# Patient Record
Sex: Female | Born: 1965 | Race: White | Hispanic: No | Marital: Married | Smoking: Former smoker
Health system: Southern US, Community
[De-identification: ages and names within clinical notes are randomized; demographics above are authoritative.]

## PROBLEM LIST (undated history)

## (undated) DIAGNOSIS — R7303 Prediabetes: Secondary | ICD-10-CM

## (undated) DIAGNOSIS — E119 Type 2 diabetes mellitus without complications: Secondary | ICD-10-CM

## (undated) DIAGNOSIS — Z9289 Personal history of other medical treatment: Secondary | ICD-10-CM

## (undated) DIAGNOSIS — R002 Palpitations: Secondary | ICD-10-CM

## (undated) DIAGNOSIS — F419 Anxiety disorder, unspecified: Secondary | ICD-10-CM

## (undated) DIAGNOSIS — R51 Headache: Secondary | ICD-10-CM

## (undated) DIAGNOSIS — E785 Hyperlipidemia, unspecified: Secondary | ICD-10-CM

## (undated) HISTORY — DX: Headache: R51

## (undated) HISTORY — DX: Anxiety disorder, unspecified: F41.9

## (undated) HISTORY — PX: AUGMENTATION MAMMAPLASTY: SUR837

## (undated) HISTORY — DX: Hyperlipidemia, unspecified: E78.5

## (undated) HISTORY — DX: Personal history of other medical treatment: Z92.89

## (undated) HISTORY — DX: Prediabetes: R73.03

## (undated) HISTORY — DX: Palpitations: R00.2

## (undated) HISTORY — DX: Type 2 diabetes mellitus without complications: E11.9

---

## 1987-07-05 HISTORY — PX: CHOLECYSTECTOMY: SHX55

## 1990-07-04 HISTORY — PX: TUBAL LIGATION: SHX77

## 1991-07-05 HISTORY — PX: AUGMENTATION MAMMAPLASTY: SUR837

## 2004-05-24 ENCOUNTER — Ambulatory Visit: Payer: Self-pay | Admitting: Family Medicine

## 2004-12-09 ENCOUNTER — Ambulatory Visit: Payer: Self-pay | Admitting: Family Medicine

## 2005-07-20 ENCOUNTER — Ambulatory Visit: Payer: Self-pay | Admitting: Family Medicine

## 2005-12-07 ENCOUNTER — Ambulatory Visit: Payer: Self-pay | Admitting: Internal Medicine

## 2006-12-19 ENCOUNTER — Telehealth (INDEPENDENT_AMBULATORY_CARE_PROVIDER_SITE_OTHER): Payer: Self-pay | Admitting: *Deleted

## 2007-03-23 ENCOUNTER — Telehealth (INDEPENDENT_AMBULATORY_CARE_PROVIDER_SITE_OTHER): Payer: Self-pay | Admitting: *Deleted

## 2007-07-12 ENCOUNTER — Ambulatory Visit: Payer: Self-pay | Admitting: Family Medicine

## 2007-07-12 DIAGNOSIS — E1169 Type 2 diabetes mellitus with other specified complication: Secondary | ICD-10-CM | POA: Insufficient documentation

## 2007-07-12 DIAGNOSIS — E7800 Pure hypercholesterolemia, unspecified: Secondary | ICD-10-CM

## 2007-07-16 ENCOUNTER — Telehealth (INDEPENDENT_AMBULATORY_CARE_PROVIDER_SITE_OTHER): Payer: Self-pay | Admitting: Internal Medicine

## 2007-07-16 LAB — CONVERTED CEMR LAB: Direct LDL: 46.4 mg/dL

## 2007-07-17 DIAGNOSIS — Z87898 Personal history of other specified conditions: Secondary | ICD-10-CM

## 2007-07-17 DIAGNOSIS — G43909 Migraine, unspecified, not intractable, without status migrainosus: Secondary | ICD-10-CM | POA: Insufficient documentation

## 2007-08-29 ENCOUNTER — Ambulatory Visit: Payer: Self-pay | Admitting: Family Medicine

## 2007-08-29 DIAGNOSIS — Z9189 Other specified personal risk factors, not elsewhere classified: Secondary | ICD-10-CM | POA: Insufficient documentation

## 2008-04-29 ENCOUNTER — Telehealth (INDEPENDENT_AMBULATORY_CARE_PROVIDER_SITE_OTHER): Payer: Self-pay | Admitting: Internal Medicine

## 2008-06-11 ENCOUNTER — Ambulatory Visit: Payer: Self-pay | Admitting: Family Medicine

## 2008-06-12 LAB — CONVERTED CEMR LAB
BUN: 14 mg/dL (ref 6–23)
Cholesterol: 235 mg/dL (ref 0–200)
Creatinine, Ser: 0.8 mg/dL (ref 0.4–1.2)
GFR calc Af Amer: 100 mL/min
GFR calc non Af Amer: 83 mL/min
HDL: 33.3 mg/dL — ABNORMAL LOW (ref >39.0)
VLDL: 165 mg/dL — ABNORMAL HIGH (ref 0–40)

## 2008-06-13 ENCOUNTER — Ambulatory Visit: Payer: Self-pay | Admitting: Family Medicine

## 2008-06-13 DIAGNOSIS — R7309 Other abnormal glucose: Secondary | ICD-10-CM | POA: Insufficient documentation

## 2008-06-16 ENCOUNTER — Telehealth (INDEPENDENT_AMBULATORY_CARE_PROVIDER_SITE_OTHER): Payer: Self-pay | Admitting: Internal Medicine

## 2008-07-14 ENCOUNTER — Telehealth (INDEPENDENT_AMBULATORY_CARE_PROVIDER_SITE_OTHER): Payer: Self-pay | Admitting: Internal Medicine

## 2008-07-18 ENCOUNTER — Ambulatory Visit: Payer: Self-pay | Admitting: Internal Medicine

## 2008-07-23 LAB — CONVERTED CEMR LAB
Creatinine,U: 132.2 mg/dL
Microalb Creat Ratio: 5.3 mg/g (ref 0.0–30.0)
Microalb, Ur: 0.7 mg/dL (ref 0.0–1.9)

## 2008-07-29 LAB — CONVERTED CEMR LAB
Cholesterol: 90 mg/dL (ref 0–200)
LDL Cholesterol: 42 mg/dL (ref 0–99)
Microalb Creat Ratio: 5.3 mg/g (ref 0.0–30.0)
Triglycerides: 89 mg/dL (ref 0–149)
VLDL: 18 mg/dL (ref 0–40)

## 2009-01-28 ENCOUNTER — Ambulatory Visit: Payer: Self-pay | Admitting: Family Medicine

## 2009-01-28 LAB — CONVERTED CEMR LAB
Cholesterol: 120 mg/dL (ref 0–200)
Glucose, Bld: 111 mg/dL — ABNORMAL HIGH (ref 70–99)
LDL Cholesterol: 57 mg/dL (ref 0–99)
Triglycerides: 109 mg/dL (ref 0.0–149.0)

## 2009-04-30 ENCOUNTER — Ambulatory Visit: Payer: Self-pay | Admitting: Family Medicine

## 2009-05-01 LAB — CONVERTED CEMR LAB
Cholesterol: 120 mg/dL (ref 0–200)
Hgb A1c MFr Bld: 6.1 % (ref 4.6–6.5)
LDL Cholesterol: 58 mg/dL (ref 0–99)
Triglycerides: 104 mg/dL (ref 0.0–149.0)

## 2009-05-07 ENCOUNTER — Ambulatory Visit: Payer: Self-pay | Admitting: Family Medicine

## 2009-05-26 ENCOUNTER — Telehealth (INDEPENDENT_AMBULATORY_CARE_PROVIDER_SITE_OTHER): Payer: Self-pay | Admitting: Internal Medicine

## 2009-08-25 ENCOUNTER — Encounter: Payer: Self-pay | Admitting: Family Medicine

## 2009-08-26 ENCOUNTER — Ambulatory Visit: Payer: Self-pay | Admitting: Family Medicine

## 2009-08-26 DIAGNOSIS — R002 Palpitations: Secondary | ICD-10-CM | POA: Insufficient documentation

## 2009-08-28 LAB — CONVERTED CEMR LAB
Basophils Relative: 0.3 % (ref 0.0–3.0)
Eosinophils Relative: 1.2 % (ref 0.0–5.0)
HCT: 42.9 % (ref 36.0–46.0)
Hemoglobin: 14.2 g/dL (ref 12.0–15.0)
Lymphs Abs: 1.9 10*3/uL (ref 0.7–4.0)
MCV: 91.4 fL (ref 78.0–100.0)
Monocytes Absolute: 0.5 10*3/uL (ref 0.1–1.0)
Neutro Abs: 6.6 10*3/uL (ref 1.4–7.7)
Platelets: 317 10*3/uL (ref 150.0–400.0)
RBC: 4.69 M/uL (ref 3.87–5.11)
WBC: 9.1 10*3/uL (ref 4.5–10.5)

## 2009-11-05 ENCOUNTER — Ambulatory Visit: Payer: Self-pay | Admitting: Family Medicine

## 2009-11-08 LAB — CONVERTED CEMR LAB
ALT: 17 units/L (ref 0–35)
AST: 16 units/L (ref 0–37)
HDL: 32.3 mg/dL — ABNORMAL LOW (ref >39.00)
LDL Cholesterol: 50 mg/dL (ref 0–99)
Total CHOL/HDL Ratio: 4
VLDL: 32.2 mg/dL (ref 0.0–40.0)

## 2010-01-20 ENCOUNTER — Ambulatory Visit: Payer: Self-pay | Admitting: Family Medicine

## 2010-04-07 ENCOUNTER — Telehealth: Payer: Self-pay | Admitting: Family Medicine

## 2010-04-08 ENCOUNTER — Telehealth: Payer: Self-pay | Admitting: Family Medicine

## 2010-04-22 ENCOUNTER — Ambulatory Visit: Payer: Self-pay | Admitting: Family Medicine

## 2010-04-22 LAB — CONVERTED CEMR LAB
Direct LDL: 65.7 mg/dL
Total CHOL/HDL Ratio: 4

## 2010-04-27 ENCOUNTER — Telehealth: Payer: Self-pay | Admitting: Family Medicine

## 2010-04-29 ENCOUNTER — Telehealth: Payer: Self-pay | Admitting: Family Medicine

## 2010-05-10 ENCOUNTER — Telehealth: Payer: Self-pay | Admitting: Family Medicine

## 2010-05-31 ENCOUNTER — Telehealth: Payer: Self-pay | Admitting: Family Medicine

## 2010-06-02 ENCOUNTER — Telehealth: Payer: Self-pay | Admitting: Family Medicine

## 2010-06-08 ENCOUNTER — Ambulatory Visit: Payer: Self-pay | Admitting: Family Medicine

## 2010-06-08 DIAGNOSIS — G43909 Migraine, unspecified, not intractable, without status migrainosus: Secondary | ICD-10-CM | POA: Insufficient documentation

## 2010-06-10 ENCOUNTER — Telehealth: Payer: Self-pay | Admitting: Family Medicine

## 2010-07-09 ENCOUNTER — Telehealth: Payer: Self-pay | Admitting: Family Medicine

## 2010-07-26 ENCOUNTER — Telehealth: Payer: Self-pay | Admitting: Family Medicine

## 2010-08-03 NOTE — Progress Notes (Signed)
Summary: Advisory for Atenolol   Phone Note From Pharmacy Call back at fax [phone]   Caller: [facility] Call For: Dr. Dayton Martes  Summary of Call: Received fax from [facility] regarding Medication Nonadherence Therapy Advisory.  Form in your IN box. Initial call taken by: Linde Gillis CMA Duncan Dull),  May 31, 2010 4:30 PM  Follow-up for Phone Call        thank you. Ruthe Mannan MD  June 01, 2010 7:18 AM

## 2010-08-03 NOTE — Progress Notes (Signed)
Summary: Rx Flexeril  Phone Note Refill Request Call back at [phone] Message from:  [facility] on May 10, 2010 11:38 AM  Refills Requested: Medication #1:  FLEXERIL 10 MG  TABS take one by mouth q hs   Last Refilled: 02/05/2010  Method Requested: Electronic Initial call taken by: Sydell Axon LPN,  May 10, 2010 11:39 AM  Follow-up for Phone Call        Rx called to pharmacy Follow-up by: Linde Gillis CMA Duncan Dull),  May 10, 2010 12:00 PM    Prescriptions: FLEXERIL 10 MG  TABS (CYCLOBENZAPRINE HCL) take one by mouth q hs  #30 x 0   Entered and Authorized by:   Ruthe Mannan MD   Signed by:   Ruthe Mannan MD on 05/10/2010   Method used:   Telephoned to ...         RxID:   [id number]

## 2010-08-03 NOTE — Progress Notes (Signed)
Summary: regarding headaches  Phone Note Call from Patient Call back at [phone]   Caller: Patient Summary of Call: Pt had been seeing Dr Darden Amber for headaches, but he has moved to another facility.  She is asking if you can start following her with her headaches,  she doesnt want to continue with Dr. Darden Amber.  Pt states her headaches are under control, she has been on the same meds for 3-4 years. Initial call taken by: Lowella Petties CMA,  April 07, 2010 11:21 AM  Follow-up for Phone Call        yes that is ok. Ruthe Mannan MD  April 07, 2010 11:28 AM   Additional Follow-up for Phone Call Additional follow up Details #1::        Advised pt, left message on machine that that is fine with Dr. Dayton Martes  Additional Follow-up by: Lowella Petties CMA,  April 07, 2010 12:20 PM

## 2010-08-03 NOTE — Progress Notes (Signed)
Summary: wants to change from trilipix  Phone Note Call from Patient Call back at Work Phone [phone] Call back at x [phone]   Caller: Patient Call For: Ruthe Mannan MD Summary of Call: Pt is asking if she can change from trilipix to fenofibrate due to the cost. It will cost her about $40.00 less.   [facility] [address]. Initial call taken by: Lowella Petties CMA, AAMA,  April 27, 2010 9:18 AM  Follow-up for Phone Call        Patient advised as instructed via telephone, Rx sent to pharmacy, 6 month f/u appt made for 10/04/2010. Follow-up by: Linde Gillis CMA Duncan Dull),  April 27, 2010 10:52 AM    New/Updated Medications: FENOFIBRATE 54 MG TABS (FENOFIBRATE) 1 tab by mouth daily. Prescriptions: FENOFIBRATE 54 MG TABS (FENOFIBRATE) 1 tab by mouth daily.  #30 x 0   Entered by:   Linde Gillis CMA (AAMA)   Authorized by:   Ruthe Mannan MD   Signed by:   Linde Gillis CMA (AAMA) on 04/27/2010   Method used:   Electronically to        [facility] (retail)       [address]       [place], [place]  [address]       Ph: [phone]       Fax: [phone]   RxID:   [id number]

## 2010-08-03 NOTE — Progress Notes (Signed)
Summary: Rx Sumatriptan  Phone Note Refill Request Call back at [phone] Message from:  [id number] on April 29, 2010 7:10 AM  Refills Requested: Medication #1:  IMITREX 100 MG TABS take one by mouth as directed prn   Last Refilled: 04/08/2010 Received E-script request please advise.   Method Requested: Telephone to Pharmacy Initial call taken by: Linde Gillis CMA Duncan Dull),  April 29, 2010 7:11 AM    Prescriptions: IMITREX 100 MG TABS (SUMATRIPTAN SUCCINATE) take one by mouth as directed prn  #10 x 0   Entered and Authorized by:   Ruthe Mannan MD   Signed by:   Ruthe Mannan MD on 04/29/2010   Method used:   Electronically to        [facility] (retail)       [address]       [place], [place]  [address]       Ph: [phone]       Fax: [phone]   RxID:   [id number]

## 2010-08-03 NOTE — Progress Notes (Signed)
Summary: imitrex  Phone Note Refill Request Message from:  Fax from Pharmacy on April 08, 2010 10:16 AM  Refills Requested: Medication #1:  IMITREX 100 MG TABS take one by mouth as directed prn   Last Refilled: 03/11/2010 Refill request from [facility] [address] [phone].   Initial call taken by: Melody Comas,  April 08, 2010 10:17 AM    Prescriptions: IMITREX 100 MG TABS (SUMATRIPTAN SUCCINATE) take one by mouth as directed prn  #10 x 0   Entered and Authorized by:   Ruthe Mannan MD   Signed by:   Ruthe Mannan MD on 04/08/2010   Method used:   Electronically to        [facility] (retail)       [address]       [place], [place]  [address]       Ph: [phone]       Fax: [phone]   RxID:   [id number]

## 2010-08-03 NOTE — Assessment & Plan Note (Signed)
Summary: 30 MIN TRANSFER FROM [facility]   Vital Signs:  Patient profile:   45 year old female Height:      63.25 inches Weight:      153.8 pounds BMI:     27.13 Temp:     98.2 degrees F oral Pulse rate:   84 / minute Pulse rhythm:   regular BP sitting:   100 / 70  (left arm) Cuff size:   regular  Vitals Entered By: Benny Lennert CMA Duncan Dull) (January 20, 2010 8:01 AM)  History of Present Illness: 45 yo female here to establish care with me.  HLD- had labs drawn in 11/2009. Has not yet discussed results.  LDL 50, HDL 32, TG 161.   On Trilipix 135 mg daily, upsets her stomach.  Often cannot take it.  Migraines- has been doing better with Atenolol 35 mg daily.  Uses IMitrex as needed.  Has not had one in over a month.  ?h/o AODM-a1c has been stable at 6.0.  Has lost 20 pounds since she was told her CBGs were elevated.  Denies any symptoms of hypo or hyperglycemia.  Current Medications (verified): 1)  Zocor 40 Mg  Tabs (Simvastatin) .Marland Kitchen.. 1 Daily For Cholesterol 2)  Flexeril 10 Mg  Tabs (Cyclobenzaprine Hcl) .... Take One By Mouth Q Hs 3)  Atenolol 25 Mg  Tabs (Atenolol) .... Take One By Mouth Daily 4)  Imitrex 100 Mg Tabs (Sumatriptan Succinate) .... Take One By Mouth As Directed Prn 5)  Gabapentin 600 Mg Tabs (Gabapentin) .... Take Two By Mouth Daily 6)  Zoloft 50 Mg Tabs (Sertraline Hcl) .... One Tablet Daily 7)  Niaspan 500 Mg Tbcr (Niacin (Antihyperlipidemic)) .Marland Kitchen.. 1 Tab By Mouth At Bedtime.  Take 81 Mg of Apirin 30 Min To 1 Hour Before To Prevent Flushing.  Allergies (verified): No Known Drug Allergies  Past History:  Past Medical History: Last updated: 08/26/2009 headaches  anxiety  palpitations hyperlipidemia     headache clinic -- [facility]  Past Surgical History: Last updated: 07/17/2007         [facility]  Family History: Last updated: 08/26/2009 sister and brother -- ? trigemeny  Social History: Last  updated: 07/17/2007 Marital Status: Married Children: 2 Occupation: operations admin  Risk Factors: Smoking Status: quit (07/17/2007)  Review of Systems      See HPI General:  Denies malaise. Eyes:  Denies blurring. CV:  Denies chest pain or discomfort. Psych:  Denies anxiety and depression.  Physical Exam  General:  Well-developed,well-nourished,in no acute distress; alert,appropriate and cooperative throughout examination Lungs:  Normal respiratory effort, chest expands symmetrically. Lungs are clear to auscultation, no crackles or wheezes. Heart:  Normal rate and regular rhythm. S1 and S2 normal without gallop, murmur, click, rub or other extra sounds. 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  D/c Trilipix due to side effects.  LDL is great, so will start Niaspan.  Discussed decreasing added sugars, eliminate trans fats, increase fiber and limit alcohol.  All these changes together can drop triglycerides by almost 50%. Recheck FLP in 3 months.  The following medications were removed from the medication list:    Trilipix 135 Mg Cpdr (Choline fenofibrate) .Marland Kitchen... 1 once daily for triglycerides  by mouth  Her updated medication list for this problem includes:    Zocor 40 Mg Tabs (Simvastatin) .Marland Kitchen... 1 daily for cholesterol    Niaspan 500 Mg Tbcr (Niacin (antihyperlipidemic)) .Marland Kitchen... 1 tab by mouth at bedtime.  take 81 mg of apirin 30 min to 1 hour before to prevent flushing.  Problem # 2:  HYPERGLYCEMIA (ICD-790.29) Assessment: Improved a1c 6.0 with normal fasting CBG. Removed AODM from problem list.    Complete Medication List: 1)  Zocor 40 Mg Tabs (Simvastatin) .Marland Kitchen.. 1 daily for cholesterol 2)  Flexeril 10 Mg Tabs (Cyclobenzaprine  hcl) .... Take one by mouth q hs 3)  Atenolol 25 Mg Tabs (Atenolol) .... Take one by mouth daily 4)  Imitrex 100 Mg Tabs (Sumatriptan succinate) .... Take one by mouth as directed prn 5)  Gabapentin 600 Mg Tabs (Gabapentin) .... Take two by mouth daily 6)  Zoloft 50 Mg Tabs (Sertraline hcl) .... One tablet daily 7)  Niaspan 500 Mg Tbcr (Niacin (antihyperlipidemic)) .Marland Kitchen.. 1 tab by mouth at bedtime.  take 81 mg of apirin 30 min to 1 hour before to prevent flushing.  Patient Instructions: 1)  Please come back in 3 months for fasting lipid panel (272.4) Prescriptions: NIASPAN 500 MG TBCR (NIACIN (ANTIHYPERLIPIDEMIC)) 1 tab by mouth at bedtime.  Take 81 mg of apirin 30 min to 1 hour before to prevent flushing.  #30 x 3   Entered and Authorized by:   Ruthe Mannan MD   Signed by:   Ruthe Mannan MD on 01/20/2010   Method used:   Electronically to        [facility] (retail)       [address]       [place], [place]  [address]       Ph: [phone] or [phone]       Fax: [phone]   RxID:   [id number]   Current Allergies (reviewed today): No known allergies

## 2010-08-03 NOTE — Assessment & Plan Note (Signed)
Summary: HEART IS FLUTTERING A LITTLE / LFW   Vital Signs:  Patient profile:   45 year old female Height:      63.25 inches Weight:      158.75 pounds BMI:     28.00 Temp:     97.9 degrees F oral Pulse rate:   84 / minute Pulse rhythm:   regular BP sitting:   116 / 70  (left arm) Cuff size:   regular  Vitals Entered By: Lewanda Rife LPN (August 26, 2009 8:43 AM)  History of Present Illness: has had some sensation of heart fluttering , off and on for a year or more    after lunch monday- symptoms started -- and it was worse than usual  tuesday - less often  this am - few fleeting episodes   feels like a small ittiy bitty jump  no pain -- just wump wump  no sob or sweaty or pain or nauseated  will sometimes take a deep breath  throat has been burning for a couple of days -- ? a little acid reflux   not exertional - exercises with no problem   does take bp med for ha prev  ran out for several days - atelolol    last nt at softball game no problems  some anx problems -- is a Product/process development scientist in general   brother and sister both have irreg hr - ? trigemeny -- later in life  EKG is ok today   caffiene some - 1 soda at lunch       Allergies (verified): No Known Drug Allergies  Past History:  Past Surgical History: Last updated: 07/17/2007         [facility]  Family History: Last updated: 08/26/2009 sister and brother -- ? 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 trigemeny  Review of Systems General:  Denies fatigue, fever, loss of appetite, and malaise. Eyes:  Denies blurring and eye irritation. CV:  Denies chest pain or discomfort  and lightheadness. Derm:  Denies itching and rash. Neuro:  Denies difficulty with concentration, headaches, numbness, and tingling. Psych:  Denies depression. Endo:  Denies cold intolerance, excessive thirst, excessive urination, and heat intolerance. Heme:  Denies abnormal bruising and bleeding.  Physical Exam  General:  Well-developed,well-nourished,in no acute distress; alert,appropriate and cooperative throughout examination Head:  normocephalic, atraumatic, and no abnormalities observed.   Eyes:  vision grossly intact, pupils equal, pupils round, and pupils reactive to light.  no conjunctival pallor, injection or icterus  Mouth:  pharynx pink and moist.   Neck:  supple with full rom and no masses or thyromegally, no JVD or carotid bruit  Chest Wall:  No deformities, masses, or tenderness noted. Lungs:  Normal respiratory effort, chest expands symmetrically. Lungs are clear to auscultation, no crackles or wheezes. Heart:  Normal rate and regular rhythm. S1 and S2 normal without gallop, murmur, click, rub or other extra sounds. Abdomen:  Bowel sounds positive,abdomen soft and non-tender without masses, organomegaly or hernias noted. no renal bruits  Msk:  No deformity or scoliosis noted of thoracic or lumbar spine.  no acute joint changes Pulses:  R and L carotid,radial,femoral,dorsalis pedis and posterior tibial pulses are full and equal bilaterally Extremities:  No clubbing, cyanosis, edema, or deformity noted with normal full range of motion of all joints.   Neurologic:  sensation intact to light touch, gait normal, and DTRs symmetrical and normal.  no tremor  Skin:  Intact without suspicious lesions or rashes Cervical Nodes:  No lymphadenopathy noted Psych:  slt anxious  good eye contact and comm skills    Impression & Recommendations:  Problem # 1:  PALPITATIONS (ICD-785.1) Assessment New feel this is multifactorial with anxiety / missed doses of atenolol / and caffiene plan  to hold caffiene and get back on atenolol consider counseling for anx in the future  (note nl EKG today)  check cbc with diff and tsh today udate if not imp 2 wk- consider cardiol/ monitor  update earlier if not imp or if worse or other symptoms (rev s/s angina in detail today)   Her updated medication list for this problem includes:    Atenolol 25 Mg Tabs (Atenolol) .Marland Kitchen... Take one by mouth daily  Orders: Venipuncture (60454) TLB-CBC Platelet - w/Differential (85025-CBCD) TLB-TSH (Thyroid Stimulating Hormone) (84443-TSH) EKG w/ Interpretation (93000)  Complete Medication List: 1)  Zocor 40 Mg Tabs (Simvastatin) .Marland Kitchen.. 1 daily for cholesterol 2)  Flexeril 10 Mg Tabs (Cyclobenzaprine hcl) .... Take one by mouth q hs 3)  Atenolol 25 Mg Tabs (Atenolol) .... Take one by mouth daily 4)  Imitrex 100 Mg Tabs (Sumatriptan succinate) .... Take one by mouth as directed prn 5)  Gabapentin 600 Mg Tabs (Gabapentin) .... Take two by mouth daily 6)  Trilipix 135 Mg Cpdr (Choline fenofibrate) .Marland Kitchen.. 1 once daily for triglycerides  by mouth  Patient Instructions: 1)  stop caffiene 2)  drink lots of water 3)  get back on atenolol  4)  labs for anemia and thyroid today  5)  if not improved in 2 weeks - let me know   Current Allergies (reviewed today): No known allergies    EKG  Procedure date:  08/26/2009  Findings:      NSR with rate of 87 and no acute changes

## 2010-08-03 NOTE — Progress Notes (Signed)
Summary: Rx Cyclobenzaprine  Phone Note Refill Request Call back at [phone] Message from:  [facility] on June 10, 2010 11:57 AM  Refills Requested: Medication #1:  FLEXERIL 10 MG  TABS take one by mouth q hs   Last Refilled: 05/10/2010 Received E-script request please advise.   Method Requested: Electronic Initial call taken by: Linde Gillis CMA Duncan Dull),  June 10, 2010 11:58 AM    Prescriptions: FLEXERIL 10 MG  TABS (CYCLOBENZAPRINE HCL) take one by mouth q hs  #30 x 0   Entered and Authorized by:   Ruthe Mannan MD   Signed by:   Ruthe Mannan MD on 06/10/2010   Method used:   Electronically to        [facility] (retail)       [address]       [place], [place]  [address]       Ph: [phone]       Fax: [phone]   RxID:   [id number]

## 2010-08-03 NOTE — Assessment & Plan Note (Signed)
Summary: F/U MIGRAINE HA/CLE   Vital Signs:  Patient profile:   45 year old female Height:      63.25 inches Weight:      162.50 pounds BMI:     28.66 Temp:     97.9 degrees F oral Pulse rate:   72 / minute Pulse rhythm:   regular BP sitting:   110 / 72  (left arm) Cuff size:   regular  Vitals Entered By: Linde Gillis CMA Duncan Dull) (June 08, 2010 8:05 AM) CC: follow up migraines   History of Present Illness: 45 yo here to follow up migraines.  Has been a migraine suffer for years.  Used to go to [facility] and [facility]. Takes Atenolol 25 mg and Gabapentin for prevention, which has helped a little (years ago had daily migraines). Now gets 9-10 migraines per month.  Takes Imitrex as abortive therapy.  This time of year, migraines typically occur more frequently as sinus headaches tend to trigger her migraines.   Allergies (verified): No Known Drug Allergies  Review of Systems      See HPI General:  Denies malaise. Neuro:  Denies numbness, sensation of room spinning, visual disturbances, and weakness.  Physical Exam  General:  Well-developed,well-nourished,in no acute distress; alert,appropriate and cooperative throughout examination Mouth:  pharynx pink and moist.   Neurologic:  sensation intact to light touch, gait normal, and DTRs symmetrical and normal.  no tremor  Psych:  slt anxious  good eye contact and comm skills    Impression & Recommendations:  Problem # 1:  MIGRAINE HEADACHE (ICD-346.90) Assessment Deteriorated  Discussed different tx options. She is using Imitrex too frequently and we need to improve her preventative therapy. Will d/c atenolol and try topamax, will titrate dose slowly to minimize side effects. Pt to follow up in one month.  Her updated medication list for this problem includes:    Atenolol 25 Mg Tabs (Atenolol) .Marland Kitchen... Take one by mouth daily    Imitrex 100 Mg Tabs (Sumatriptan succinate) .Marland Kitchen... Take one by mouth as directed  prn  Complete Medication List: 1)  Zocor 40 Mg Tabs (Simvastatin) .Marland Kitchen.. 1 daily for cholesterol 2)  Flexeril 10 Mg Tabs (Cyclobenzaprine hcl) .... Take one by mouth q hs 3)  Atenolol 25 Mg Tabs (Atenolol) .... Take one by mouth daily 4)  Imitrex 100 Mg Tabs (Sumatriptan succinate) .... Take one by mouth as directed prn 5)  Gabapentin 600 Mg Tabs (Gabapentin) .... Take two by mouth daily 6)  Fenofibrate 54 Mg Tabs (Fenofibrate) .Marland Kitchen.. 1 tab by mouth daily. 7)  Topiramate 50 Mg Tabs (Topiramate) .... 1/2 tab by mouth at bedtime x 1 week, then 1 tab by mouth at bedtime.  Patient Instructions: 1)  We can stop your atenolol. 2)  Take your topamax as prescribed, call me in 1 month with an update.   Prescriptions: TOPIRAMATE 50 MG TABS (TOPIRAMATE) 1/2 tab by mouth at bedtime x 1 week, then 1 tab by mouth at bedtime.  #60 x 0   Entered and Authorized by:   Ruthe Mannan MD   Signed by:   Ruthe Mannan MD on 06/08/2010   Method used:   Electronically to        [facility] (retail)       [address]       [place], [place]  [address]       Ph: [phone]       Fax: [phone]   RxID:   [id number]  Orders Added: 1)  Est. Patient Level IV [41324]    Current Allergies (reviewed today): No known allergies

## 2010-08-03 NOTE — Progress Notes (Signed)
Summary: Rx Sumatriptan  Phone Note Refill Request Call back at [phone] Message from:  Wandalee Ferdinand on June 02, 2010 7:48 AM  Refills Requested: Medication #1:  IMITREX 100 MG TABS take one by mouth as directed prn   Last Refilled: 04/29/2010 Received e-script refill request please advise.   Method Requested: Telephone to Pharmacy Initial call taken by: Linde Gillis CMA Duncan Dull),  June 02, 2010 7:49 AM  Follow-up for Phone Call        She is over using imitrex..10 used in last month...i will refill..but I recommend an appt with Dr. Dayton Martes or other MD to discuss migrane prevention medicaiton before any further prescriptions.  Follow-up by: Kerby Nora MD,  June 02, 2010 8:19 AM  Additional Follow-up for Phone Call Additional follow up Details #1::        Left message on voicemail at patients job advising as instructed.  Advised her to call our office to schedule an appt to discuss migraine prevention.   Additional Follow-up by: Linde Gillis CMA Duncan Dull),  June 02, 2010 8:36 AM    Prescriptions: IMITREX 100 MG TABS (SUMATRIPTAN SUCCINATE) take one by mouth as directed prn  #10 x 0   Entered and Authorized by:   Kerby Nora MD   Signed by:   Kerby Nora MD on 06/02/2010   Method used:   Electronically to        [facility] (retail)       [address]       [place], [place]  [address]       Ph: [phone]       Fax: [phone]   RxID:   [id number]   Appended Document: Rx Sumatriptan agreed. thank you.

## 2010-08-05 NOTE — Progress Notes (Signed)
Summary: Rx Flexeril  Phone Note Refill Request Call back at [phone] Message from:  [facility] on July 09, 2010 10:16 AM  Refills Requested: Medication #1:  FLEXERIL 10 MG  TABS take one by mouth q hs   Last Refilled: 06/10/2010 Received E-script request please advise.   Method Requested: Telephone to Pharmacy Initial call taken by: Linde Gillis CMA Duncan Dull),  July 09, 2010 10:17 AM  Follow-up for Phone Call        Rx called to pharmacy Follow-up by: Linde Gillis CMA Duncan Dull),  July 09, 2010 10:26 AM    Prescriptions: FLEXERIL 10 MG  TABS (CYCLOBENZAPRINE HCL) take one by mouth q hs  #30 x 0   Entered and Authorized by:   Ruthe Mannan MD   Signed by:   Ruthe Mannan MD on 07/09/2010   Method used:   Telephoned to ...         RxID:   [id number]

## 2010-08-05 NOTE — Progress Notes (Signed)
Summary: gabapentin  Phone Note Refill Request Message from:  Fax from Pharmacy on July 26, 2010 3:40 PM  Refills Requested: Medication #1:  GABAPENTIN 600 MG TABS take two by mouth daily   Last Refilled: 04/28/2010 Refill request from [facility] [address] [phone]  Initial call taken by: Melody Comas,  July 26, 2010 3:40 PM  Follow-up for Phone Call        Rx called to pharmacy Follow-up by: Linde Gillis CMA Duncan Dull),  July 26, 2010 4:23 PM    Prescriptions: GABAPENTIN 600 MG TABS (GABAPENTIN) take two by mouth daily  #90 x 0   Entered and Authorized by:   Ruthe Mannan MD   Signed by:   Ruthe Mannan MD on 07/26/2010   Method used:   Telephoned to ...         RxID:   [id number]

## 2010-08-09 ENCOUNTER — Telehealth: Payer: Self-pay | Admitting: Family Medicine

## 2010-08-16 ENCOUNTER — Telehealth: Payer: Self-pay | Admitting: Family Medicine

## 2010-08-19 NOTE — Progress Notes (Signed)
Summary: imitrex  Phone Note Refill Request Message from:  Patient on August 09, 2010 8:47 AM  Refills Requested: Medication #1:  IMITREX 100 MG TABS take one by mouth as directed prn Patient says that she is completley out. She had a migraine over the weekend and didn't have any meds because it was denied on friday. [facility] [address].   Initial call taken by: Melody Comas,  August 09, 2010 8:47 AM  Follow-up for Phone Call        Rx sent to pharmacy, patient notified via telephone. Follow-up by: Linde Gillis CMA Duncan Dull),  August 09, 2010 9:35 AM    Prescriptions: IMITREX 100 MG TABS (SUMATRIPTAN SUCCINATE) take one by mouth as directed prn  #9 Tablet x 0   Entered by:   Linde Gillis CMA (AAMA)   Authorized by:   Ruthe Mannan MD   Signed by:   Linde Gillis CMA (AAMA) on 08/09/2010   Method used:   Electronically to        [facility] (retail)       [address]       [place], [place]  [address]       Ph: [phone]       Fax: [phone]   RxID:   [id number] IMITREX 100 MG TABS (SUMATRIPTAN SUCCINATE) take one by mouth as directed prn  #9 Tablet x 0   Entered and Authorized by:   Ruthe Mannan MD   Signed by:   Ruthe Mannan MD on 08/09/2010   Method used:   Telephoned to ...         RxID:   [id number]

## 2010-08-25 NOTE — Progress Notes (Signed)
Summary: Rx Cyclobenzaprine  Phone Note Refill Request Call back at [phone] Message from:  Madilyn Hook on August 16, 2010 8:40 AM  Refills Requested: Medication #1:  FLEXERIL 10 MG  TABS take one by mouth q hs   Last Refilled: 07/09/2010 Received E-script request please advise.   Method Requested: Telephone to Pharmacy Initial call taken by: Linde Gillis CMA [facility]),  August 16, 2010 8:40 AM    Prescriptions: FLEXERIL 10 MG  TABS (CYCLOBENZAPRINE HCL) take one by mouth q hs  #30 x 0   Entered and Authorized by:   Ruthe Mannan MD   Signed by:   Ruthe Mannan MD on 08/16/2010   Method used:   Electronically to        [id number] (retail)       [address]       [place], [place]  [address]       Ph: [phone]       Fax: [phone]   RxID:   [id number]

## 2010-09-02 ENCOUNTER — Telehealth: Payer: Self-pay | Admitting: Family Medicine

## 2010-09-09 NOTE — Progress Notes (Signed)
Summary: Rx Gabapentin & Imitrex  Phone Note Refill Request Call back at [phone] Message from:  [id number] on September 02, 2010 2:05 PM  Refills Requested: Medication #1:  GABAPENTIN 600 MG TABS take two by mouth daily   Last Refilled: 07/26/2010  Medication #2:  IMITREX 100 MG TABS take one by mouth as directed prn   Last Refilled: 07/26/2010 Received E-script request please advise.   Method Requested: Electronic Initial call taken by: Linde Gillis CMA [facility]),  September 02, 2010 2:06 PM    Prescriptions: GABAPENTIN 600 MG TABS (GABAPENTIN) take two by mouth daily  #90 x 0   Entered and Authorized by:   Ruthe Mannan MD   Signed by:   Ruthe Mannan MD on 09/02/2010   Method used:   Electronically to        [id number] (retail)       [address]       [place], [place]  [address]       Ph: [phone]       Fax: [phone]   RxID:   [id number] IMITREX 100 MG TABS (SUMATRIPTAN SUCCINATE) take one by mouth as directed prn  #9 Tablet x 0   Entered and Authorized by:   Ruthe Mannan MD   Signed by:   Ruthe Mannan MD on 09/02/2010   Method used:   Electronically to        [facility] (retail)       [address]       [place], [place]  [address]       Ph: [phone]       Fax: [phone]   RxID:   [id number]

## 2010-09-21 ENCOUNTER — Other Ambulatory Visit: Payer: Self-pay | Admitting: Family Medicine

## 2010-09-21 MED ORDER — CYCLOBENZAPRINE HCL 10 MG PO TABS: 10.0000 mg | ORAL_TABLET | Freq: Every day | ORAL | Status: DC

## 2010-09-22 MED ORDER — CYCLOBENZAPRINE HCL 10 MG PO TABS: 10.0000 mg | ORAL_TABLET | Freq: Every day | ORAL | Status: DC

## 2010-09-29 ENCOUNTER — Other Ambulatory Visit: Payer: Self-pay | Admitting: Family Medicine

## 2010-09-30 ENCOUNTER — Other Ambulatory Visit: Payer: Self-pay | Admitting: Family Medicine

## 2010-09-30 DIAGNOSIS — E7800 Pure hypercholesterolemia, unspecified: Secondary | ICD-10-CM

## 2010-10-04 ENCOUNTER — Other Ambulatory Visit (INDEPENDENT_AMBULATORY_CARE_PROVIDER_SITE_OTHER): Payer: PRIVATE HEALTH INSURANCE | Admitting: Family Medicine

## 2010-10-04 DIAGNOSIS — E7800 Pure hypercholesterolemia, unspecified: Secondary | ICD-10-CM

## 2010-10-04 LAB — LIPID PANEL
Cholesterol: 153 mg/dL (ref 0–200)
HDL: 37.6 mg/dL — ABNORMAL LOW (ref >39.00)
Total CHOL/HDL Ratio: 4
VLDL: 72.4 mg/dL — ABNORMAL HIGH (ref 0.0–40.0)

## 2010-10-04 LAB — LDL CHOLESTEROL, DIRECT: Direct LDL: 62.9 mg/dL

## 2010-10-04 LAB — HEPATIC FUNCTION PANEL
Albumin: 4.1 g/dL (ref 3.5–5.2)
Total Protein: 6.8 g/dL (ref 6.0–8.3)

## 2010-10-27 ENCOUNTER — Other Ambulatory Visit: Payer: Self-pay | Admitting: Family Medicine

## 2010-10-28 ENCOUNTER — Other Ambulatory Visit: Payer: Self-pay | Admitting: Family Medicine

## 2010-10-29 ENCOUNTER — Other Ambulatory Visit: Payer: Self-pay | Admitting: *Deleted

## 2010-10-29 MED ORDER — GABAPENTIN 600 MG PO TABS: ORAL_TABLET | ORAL | Status: DC

## 2010-10-29 MED ORDER — CYCLOBENZAPRINE HCL 10 MG PO TABS: 10.0000 mg | ORAL_TABLET | Freq: Every day | ORAL | Status: DC

## 2010-10-29 NOTE — Telephone Encounter (Signed)
Patient requested Rx refills.

## 2010-10-31 ENCOUNTER — Other Ambulatory Visit: Payer: Self-pay | Admitting: Family Medicine

## 2010-11-02 ENCOUNTER — Other Ambulatory Visit: Payer: Self-pay | Admitting: *Deleted

## 2010-11-02 NOTE — Telephone Encounter (Signed)
This was blank.  Do you know what this was intended to say?

## 2010-11-03 MED ORDER — SUMATRIPTAN SUCCINATE 100 MG PO TABS: 100.0000 mg | ORAL_TABLET | ORAL | Status: DC | PRN

## 2010-11-28 ENCOUNTER — Other Ambulatory Visit: Payer: Self-pay | Admitting: Family Medicine

## 2010-12-26 ENCOUNTER — Other Ambulatory Visit: Payer: Self-pay | Admitting: Family Medicine

## 2011-01-12 ENCOUNTER — Other Ambulatory Visit: Payer: Self-pay | Admitting: Family Medicine

## 2011-01-18 ENCOUNTER — Other Ambulatory Visit: Payer: Self-pay | Admitting: Family Medicine

## 2011-01-26 ENCOUNTER — Other Ambulatory Visit: Payer: Self-pay | Admitting: Family Medicine

## 2011-02-10 ENCOUNTER — Other Ambulatory Visit: Payer: Self-pay | Admitting: Family Medicine

## 2011-02-11 NOTE — Telephone Encounter (Signed)
Sent, please notify pt.  Thanks.   

## 2011-02-11 NOTE — Telephone Encounter (Signed)
Patient advised as instructed via telephone. 

## 2011-02-16 ENCOUNTER — Other Ambulatory Visit: Payer: Self-pay | Admitting: Family Medicine

## 2011-02-24 ENCOUNTER — Other Ambulatory Visit: Payer: Self-pay | Admitting: Family Medicine

## 2011-02-25 ENCOUNTER — Other Ambulatory Visit: Payer: PRIVATE HEALTH INSURANCE

## 2011-02-25 NOTE — Telephone Encounter (Signed)
Patient hasn't been seen since 06/2010, please advise.

## 2011-03-01 ENCOUNTER — Other Ambulatory Visit: Payer: PRIVATE HEALTH INSURANCE

## 2011-03-03 ENCOUNTER — Other Ambulatory Visit (INDEPENDENT_AMBULATORY_CARE_PROVIDER_SITE_OTHER): Payer: PRIVATE HEALTH INSURANCE

## 2011-03-03 ENCOUNTER — Ambulatory Visit: Payer: PRIVATE HEALTH INSURANCE | Admitting: Family Medicine

## 2011-03-03 DIAGNOSIS — R739 Hyperglycemia, unspecified: Secondary | ICD-10-CM

## 2011-03-03 DIAGNOSIS — E7800 Pure hypercholesterolemia, unspecified: Secondary | ICD-10-CM

## 2011-03-03 DIAGNOSIS — R7309 Other abnormal glucose: Secondary | ICD-10-CM

## 2011-03-03 LAB — LIPID PANEL
Cholesterol: 132 mg/dL (ref 0–200)
VLDL: 50.2 mg/dL — ABNORMAL HIGH (ref 0.0–40.0)

## 2011-03-03 LAB — BASIC METABOLIC PANEL
BUN: 19 mg/dL (ref 6–23)
GFR: 78.26 mL/min (ref >60.00)
Potassium: 4.6 mEq/L (ref 3.5–5.1)

## 2011-03-09 ENCOUNTER — Other Ambulatory Visit: Payer: Self-pay | Admitting: *Deleted

## 2011-03-09 MED ORDER — SUMATRIPTAN SUCCINATE 100 MG PO TABS: 100.0000 mg | ORAL_TABLET | ORAL | Status: DC | PRN

## 2011-03-09 NOTE — Telephone Encounter (Signed)
Patient notified as instructed by telephone. 

## 2011-03-09 NOTE — Telephone Encounter (Signed)
For the record- I have not seen that request Will refil electronically

## 2011-03-09 NOTE — Telephone Encounter (Signed)
Pt.states her pharmacy has faxed a request but we havent responded.  OK to refill?  Pt uses [facility].

## 2011-04-04 ENCOUNTER — Other Ambulatory Visit: Payer: Self-pay | Admitting: Family Medicine

## 2011-04-04 NOTE — Telephone Encounter (Signed)
Patient hasn't been seen since 06/2010, please advise. 

## 2011-04-17 ENCOUNTER — Other Ambulatory Visit: Payer: Self-pay | Admitting: Family Medicine

## 2011-05-18 ENCOUNTER — Other Ambulatory Visit: Payer: Self-pay | Admitting: Family Medicine

## 2011-05-19 NOTE — Telephone Encounter (Signed)
Last OV 06/2010, new patient 01/2010.  Please advise.

## 2011-05-30 ENCOUNTER — Other Ambulatory Visit: Payer: Self-pay | Admitting: Family Medicine

## 2011-07-05 ENCOUNTER — Other Ambulatory Visit: Payer: Self-pay | Admitting: Family Medicine

## 2011-07-18 ENCOUNTER — Other Ambulatory Visit: Payer: Self-pay | Admitting: Family Medicine

## 2011-07-19 ENCOUNTER — Other Ambulatory Visit: Payer: Self-pay | Admitting: Internal Medicine

## 2011-07-19 MED ORDER — FENOFIBRATE 54 MG PO TABS: 54.0000 mg | ORAL_TABLET | Freq: Two times a day (BID) | ORAL | Status: DC

## 2011-07-19 NOTE — Telephone Encounter (Signed)
Ok to refill one month.  Needs CPX for additional refills.

## 2011-07-19 NOTE — Telephone Encounter (Signed)
Patient advised as instructed via telephone.  She stated that she wasn't aware that she needed a physical every year, she thought she could wait until she turned 48 to start getting physicals yearly.  I advised patient that she does need a yearly exam.  Rx for Fenofibrate sent to pharmacy [id number], Rx for #30 was cancelled.

## 2011-07-19 NOTE — Telephone Encounter (Signed)
Rx's sent electronically, called [facility] and left a message on pharmacy voicemail to notify patient that this will be her last refill until she calls our office, schedules a CPX, and keeps that appt.

## 2011-07-19 NOTE — Telephone Encounter (Signed)
Talked with Janice Greene last fall it was discussed to double up on fenofibrate and now she is now wanting to and wants to know if it's ok to do that.  Plus she needs 60 pills called in instead of 30.  Please advise.

## 2011-07-19 NOTE — Telephone Encounter (Signed)
Patient hasn't been seen since 06/2010, transferred care to Dr. Dayton Martes 01/2010.  Please advise.

## 2011-07-19 NOTE — Telephone Encounter (Signed)
Yes ok to double on current dose but she should be aware that when taken with a statin (zocor) can worsen the potential for muscle aches. Keep an eye on that and come see me for labs and follow up visit in 8 weeks.

## 2011-07-25 ENCOUNTER — Encounter: Payer: PRIVATE HEALTH INSURANCE | Admitting: Family Medicine

## 2011-07-27 ENCOUNTER — Other Ambulatory Visit: Payer: Self-pay | Admitting: Family Medicine

## 2011-07-27 NOTE — Telephone Encounter (Signed)
This is Dr Dellie Catholic pt. [facility] request refill Imitrex 100 mg. Pt last seen 06/08/10. Pt already scheduled for CPX with Dr Dayton Martes 08/02/11. Dr Dayton Martes will be back in office on 07/28/11.

## 2011-07-27 NOTE — Telephone Encounter (Signed)
Will refill electronically to get by until her appt

## 2011-07-28 NOTE — Telephone Encounter (Signed)
Pt called and said med had not been called in. I explained Dr Milinda Antis sent yesterday. I called pharmacy and gave verbal rx to [facility] [address]. Pt notified and apprecitated.

## 2011-08-01 ENCOUNTER — Encounter: Payer: Self-pay | Admitting: Family Medicine

## 2011-08-02 ENCOUNTER — Ambulatory Visit (INDEPENDENT_AMBULATORY_CARE_PROVIDER_SITE_OTHER): Payer: PRIVATE HEALTH INSURANCE | Admitting: Family Medicine

## 2011-08-02 ENCOUNTER — Encounter: Payer: Self-pay | Admitting: Family Medicine

## 2011-08-02 VITALS — BP 120/72 | HR 90 | Temp 97.6°F | Ht 64.0 in | Wt 158.4 lb

## 2011-08-02 DIAGNOSIS — Z0000 Encounter for general adult medical examination without abnormal findings: Secondary | ICD-10-CM | POA: Insufficient documentation

## 2011-08-02 DIAGNOSIS — R7309 Other abnormal glucose: Secondary | ICD-10-CM

## 2011-08-02 DIAGNOSIS — R739 Hyperglycemia, unspecified: Secondary | ICD-10-CM

## 2011-08-02 DIAGNOSIS — E7800 Pure hypercholesterolemia, unspecified: Secondary | ICD-10-CM

## 2011-08-02 DIAGNOSIS — Z87898 Personal history of other specified conditions: Secondary | ICD-10-CM

## 2011-08-02 DIAGNOSIS — Z1231 Encounter for screening mammogram for malignant neoplasm of breast: Secondary | ICD-10-CM

## 2011-08-02 DIAGNOSIS — G43909 Migraine, unspecified, not intractable, without status migrainosus: Secondary | ICD-10-CM

## 2011-08-02 LAB — LIPID PANEL
HDL: 43 mg/dL (ref >39.00)
Triglycerides: 250 mg/dL — ABNORMAL HIGH (ref 0.0–149.0)

## 2011-08-02 LAB — BASIC METABOLIC PANEL
BUN: 14 mg/dL (ref 6–23)
CO2: 24 mEq/L (ref 19–32)
Calcium: 9.2 mg/dL (ref 8.4–10.5)
Creatinine, Ser: 1 mg/dL (ref 0.4–1.2)
GFR: 66.05 mL/min (ref >60.00)
Glucose, Bld: 171 mg/dL — ABNORMAL HIGH (ref 70–99)

## 2011-08-02 LAB — HEPATIC FUNCTION PANEL
ALT: 30 U/L (ref 0–35)
Albumin: 4.5 g/dL (ref 3.5–5.2)
Total Protein: 7.8 g/dL (ref 6.0–8.3)

## 2011-08-02 MED ORDER — ALPRAZOLAM 0.25 MG PO TABS: 0.2500 mg | ORAL_TABLET | Freq: Three times a day (TID) | ORAL | Status: AC | PRN

## 2011-08-02 NOTE — Progress Notes (Signed)
Subjective:    Patient ID: Janice Greene, female    DOB: [birth year], 46 y.o.   MRN: [id number]  HPI  46 yo here for CPX.  Dr. Elana Alm is her OBGYN, last pap in 10/2010.  No h/o abnormal pap smears. Due for mammogram.  HLD-  Lab Results  Component Value Date   CHOL 132 03/03/2011   HDL 45.10 03/03/2011   LDLCALC 50 11/05/2009   LDLDIRECT 65.6 03/03/2011   TRIG 251.0* 03/03/2011   CHOLHDL 3 03/03/2011    On Trilipix 135 mg daily upsets her stomach.  Often cannot take it.  Taking her Zocor as well.  Migraines- has been doing better with Atenolol 35 mg daily.  Uses IMitrex as needed.  Had one a few weeks ago but under a lot of stress. Her mother is going into a nursing home.  Often feels very anxious when her mom is in pain, overall she feels she is coping ok. No SI or HI.  ?h/o AODM-a1c has been stable at 6.0.    Denies any symptoms of hypo or hyperglycemia.  Lab Results  Component Value Date   HGBA1C 6.0 11/05/2009   Wt Readings from Last 3 Encounters:  08/02/11 158 lb 6.4 oz (71.85 kg)  06/08/10 162 lb 8 oz (73.71 kg)  01/20/10 153 lb 12.8 oz (69.763 kg)     Review of Systems See HPI Patient reports no  vision/ hearing changes,anorexia, weight change, fever ,adenopathy, persistant / recurrent hoarseness, swallowing issues, chest pain, edema,persistant / recurrent cough, hemoptysis, dyspnea(rest, exertional, paroxysmal nocturnal), gastrointestinal  bleeding (melena, rectal bleeding), abdominal pain, excessive heart burn, GU symptoms(dysuria, hematuria, pyuria, voiding/incontinence  Issues) syncope, focal weakness, severe memory loss, concerning skin lesions, depression, anxiety, abnormal bruising/bleeding, major joint swelling, breast masses or abnormal vaginal bleeding.       Objective:   Physical Exam BP 120/72  Pulse 90  Temp(Src) 97.6 F (36.4 C) (Oral)  Ht 5\' 4"  (1.626 m)  Wt 158 lb 6.4 oz (71.85 kg)  BMI 27.19 kg/m2  SpO2 98%   General:   Well-developed,well-nourished,in no acute distress; alert,appropriate and cooperative throughout examination Head:  normocephalic and atraumatic.   Eyes:  vision grossly intact, pupils equal, pupils round, and pupils reactive to light.   Ears:  R ear normal and L ear normal.   Nose:  no external deformity.   Mouth:  good dentition.   Neck:  No deformities, masses, or tenderness noted. Lungs:  Normal respiratory effort, chest expands symmetrically. Lungs are clear to auscultation, no crackles or wheezes. Heart:  Normal rate and regular rhythm. S1 and S2 normal without gallop, murmur, click, rub or other extra sounds. Abdomen:  Bowel sounds positive,abdomen soft and non-tender without masses, organomegaly or hernias noted. Msk:  No deformity or scoliosis noted of thoracic or lumbar spine.   Extremities:  No clubbing, cyanosis, edema, or deformity noted with normal full range of motion of all joints.   Neurologic:  alert & oriented X3 and gait normal.   Skin:  Intact without suspicious lesions or rashes Cervical Nodes:  No lymphadenopathy noted Axillary Nodes:  No palpable lymphadenopathy Psych:  Cognition and judgment appear intact. Alert and cooperative with normal attention span and concentration. No apparent delusions, illusions, hallucinations      Assessment & Plan:   1. Routine general medical examination at a health care facility  Reviewed preventive care protocols, scheduled due services, and updated immunizations Discussed nutrition, exercise, diet, and healthy lifestyle.  Basic Metabolic Panel (BMET)  MM Digital Screening  2. Pure hypercholesterolemia  Hepatic function panel, Lipid Profile  3. Acute grief rxn Coping well.  Will give rx for as needed xanax. Discussed sedation and addiction potential. The patient indicates understanding of these issues and agrees with the plan.

## 2011-08-02 NOTE — Patient Instructions (Signed)
Good to see you. Please stop to see Janice Greene on your way out. I hope your mother feels better soon.

## 2011-08-10 ENCOUNTER — Other Ambulatory Visit: Payer: Self-pay | Admitting: Family Medicine

## 2011-09-06 ENCOUNTER — Ambulatory Visit: Payer: PRIVATE HEALTH INSURANCE

## 2011-09-07 ENCOUNTER — Other Ambulatory Visit: Payer: Self-pay | Admitting: Family Medicine

## 2011-09-11 ENCOUNTER — Other Ambulatory Visit: Payer: Self-pay | Admitting: Family Medicine

## 2011-09-12 NOTE — Telephone Encounter (Signed)
Received refill request electronically from pharmacy. Is it okay to refill medication? 

## 2011-09-13 NOTE — Telephone Encounter (Signed)
E prescribe error notice received.  Checked with pharmacy, scripts were received.

## 2011-10-05 ENCOUNTER — Other Ambulatory Visit: Payer: Self-pay | Admitting: Family Medicine

## 2011-10-25 ENCOUNTER — Other Ambulatory Visit: Payer: Self-pay | Admitting: *Deleted

## 2011-10-25 MED ORDER — SUMATRIPTAN SUCCINATE 100 MG PO TABS: 100.0000 mg | ORAL_TABLET | ORAL | Status: DC | PRN

## 2011-10-25 MED ORDER — CYCLOBENZAPRINE HCL 10 MG PO TABS: 10.0000 mg | ORAL_TABLET | Freq: Every evening | ORAL | Status: DC | PRN

## 2011-10-25 MED ORDER — GABAPENTIN 600 MG PO TABS: 600.0000 mg | ORAL_TABLET | Freq: Two times a day (BID) | ORAL | Status: DC

## 2011-10-25 MED ORDER — ALPRAZOLAM 0.25 MG PO TABS: 0.2500 mg | ORAL_TABLET | Freq: Three times a day (TID) | ORAL | Status: DC | PRN

## 2011-10-25 NOTE — Telephone Encounter (Signed)
rx called to pharmacy 

## 2011-10-25 NOTE — Telephone Encounter (Signed)
neurontin last filled 09-08-2011 Flexeril  Last filled 09-12-2011 Sumatriptan last filled 10-05-2011 Patient also requesting refill on alprazolam 0.25 take 1 tab tid as needed for sleep or anxiety last fill 08-02-2011

## 2011-11-17 ENCOUNTER — Other Ambulatory Visit: Payer: Self-pay | Admitting: Family Medicine

## 2011-11-24 ENCOUNTER — Other Ambulatory Visit: Payer: Self-pay | Admitting: Family Medicine

## 2011-12-07 ENCOUNTER — Other Ambulatory Visit: Payer: Self-pay

## 2011-12-07 MED ORDER — SUMATRIPTAN SUCCINATE 100 MG PO TABS: ORAL_TABLET | ORAL | Status: DC

## 2011-12-07 MED ORDER — ALPRAZOLAM 0.25 MG PO TABS: 0.2500 mg | ORAL_TABLET | Freq: Three times a day (TID) | ORAL | Status: DC | PRN

## 2011-12-07 NOTE — Telephone Encounter (Signed)
[facility] faxed request Imitrex. Last filled 11/17/11.Please advise.

## 2011-12-07 NOTE — Telephone Encounter (Signed)
Xanax called to pharmacy. 

## 2011-12-25 ENCOUNTER — Other Ambulatory Visit: Payer: Self-pay | Admitting: Family Medicine

## 2011-12-29 ENCOUNTER — Other Ambulatory Visit: Payer: Self-pay | Admitting: Family Medicine

## 2012-01-04 ENCOUNTER — Ambulatory Visit
Admission: RE | Admit: 2012-01-04 | Discharge: 2012-01-04 | Disposition: A | Discharge status: Home / Self Care | Payer: PRIVATE HEALTH INSURANCE | Source: Ambulatory Visit | Attending: Family Medicine | Admitting: Family Medicine

## 2012-01-04 ENCOUNTER — Other Ambulatory Visit: Payer: Self-pay | Admitting: Family Medicine

## 2012-01-04 DIAGNOSIS — Z1231 Encounter for screening mammogram for malignant neoplasm of breast: Secondary | ICD-10-CM

## 2012-01-10 ENCOUNTER — Encounter: Payer: Self-pay | Admitting: *Deleted

## 2012-01-10 ENCOUNTER — Encounter: Payer: Self-pay | Admitting: Family Medicine

## 2012-01-11 ENCOUNTER — Other Ambulatory Visit: Payer: Self-pay | Admitting: *Deleted

## 2012-01-11 MED ORDER — ALPRAZOLAM 0.25 MG PO TABS: 0.2500 mg | ORAL_TABLET | Freq: Three times a day (TID) | ORAL | Status: DC | PRN

## 2012-01-11 NOTE — Telephone Encounter (Signed)
Medicine called to pharmacy. 

## 2012-01-11 NOTE — Telephone Encounter (Signed)
Faxed refill request from [facility], last filled 12/07/11.

## 2012-01-17 ENCOUNTER — Other Ambulatory Visit: Payer: Self-pay | Admitting: Family Medicine

## 2012-01-31 ENCOUNTER — Other Ambulatory Visit: Payer: Self-pay | Admitting: Family Medicine

## 2012-02-03 ENCOUNTER — Other Ambulatory Visit: Payer: Self-pay | Admitting: Family Medicine

## 2012-02-27 ENCOUNTER — Other Ambulatory Visit: Payer: Self-pay | Admitting: Family Medicine

## 2012-02-28 ENCOUNTER — Other Ambulatory Visit: Payer: Self-pay | Admitting: Family Medicine

## 2012-02-28 ENCOUNTER — Other Ambulatory Visit: Payer: Self-pay | Admitting: *Deleted

## 2012-02-28 MED ORDER — ALPRAZOLAM 0.25 MG PO TABS: 0.2500 mg | ORAL_TABLET | Freq: Three times a day (TID) | ORAL | Status: DC | PRN

## 2012-02-28 NOTE — Telephone Encounter (Signed)
Faxed refill request from [facility].  Last filled 01/11/12.

## 2012-02-28 NOTE — Telephone Encounter (Signed)
Med[facility] to [facility] 

## 2012-02-29 NOTE — Telephone Encounter (Signed)
Last filled 02/03/12 for # 9.

## 2012-03-16 ENCOUNTER — Other Ambulatory Visit: Payer: Self-pay | Admitting: Family Medicine

## 2012-03-16 NOTE — Telephone Encounter (Signed)
Last filled 02/28/12

## 2012-04-05 ENCOUNTER — Other Ambulatory Visit: Payer: Self-pay | Admitting: Family Medicine

## 2012-04-06 ENCOUNTER — Other Ambulatory Visit (INDEPENDENT_AMBULATORY_CARE_PROVIDER_SITE_OTHER): Payer: PRIVATE HEALTH INSURANCE

## 2012-04-06 ENCOUNTER — Other Ambulatory Visit: Payer: Self-pay | Admitting: *Deleted

## 2012-04-06 DIAGNOSIS — E119 Type 2 diabetes mellitus without complications: Secondary | ICD-10-CM

## 2012-04-06 MED ORDER — ALPRAZOLAM 0.25 MG PO TABS: 0.2500 mg | ORAL_TABLET | Freq: Three times a day (TID) | ORAL | Status: DC | PRN

## 2012-04-06 NOTE — Telephone Encounter (Signed)
Med[facility] to [facility] 

## 2012-04-06 NOTE — Telephone Encounter (Signed)
Faxed refill request from [facility], last filled 02/28/12.

## 2012-04-09 ENCOUNTER — Other Ambulatory Visit: Payer: Self-pay | Admitting: *Deleted

## 2012-04-09 MED ORDER — METFORMIN HCL 500 MG PO TABS: 500.0000 mg | ORAL_TABLET | Freq: Two times a day (BID) | ORAL | Status: DC

## 2012-04-16 ENCOUNTER — Encounter: Payer: Self-pay | Admitting: Family Medicine

## 2012-04-16 ENCOUNTER — Ambulatory Visit (INDEPENDENT_AMBULATORY_CARE_PROVIDER_SITE_OTHER): Payer: PRIVATE HEALTH INSURANCE | Admitting: Family Medicine

## 2012-04-16 VITALS — BP 130/82 | HR 102 | Temp 98.1°F | Wt 162.2 lb

## 2012-04-16 DIAGNOSIS — J069 Acute upper respiratory infection, unspecified: Secondary | ICD-10-CM

## 2012-04-16 MED ORDER — GUAIFENESIN-CODEINE 100-10 MG/5ML PO SYRP: 5.0000 mL | ORAL_SOLUTION | Freq: Two times a day (BID) | ORAL | Status: DC | PRN

## 2012-04-16 NOTE — Assessment & Plan Note (Signed)
Anticipate viral given short duration See pt instructions. Cough syrup called in. Update if not improving as expected or any worsening.  To call at end of week with update - if not improved, would consider starting zpack to cover bronchitis or abx for sinusitis if worse head congestion sxs.

## 2012-04-16 NOTE — Progress Notes (Signed)
  Subjective:    Patient ID: Janice Greene, female    DOB: September 24, 1965, 45 y.o.   MRN: [id number]  HPI CC: ST, cold?  6d h/o ST progressively worsening along with sneezing.  + coughing leading to sore ribs.  Overall dry cough.  Temp elevated at night to 99.9.  Occasional earache, not recently.  Mild PNdrainage.  Mainly chest congestion.  So far has tried mucinex along with honey lemon cough drops and advil.  No abd pain n/v, tooth pain, HA, rash.  Granddaughter sick last weekend with croup. No smokers at home. No h/o asthma/COPD  Past Medical History  Diagnosis Date  . Headache   . Anxiety   . Palpitations   . Hyperlipidemia      Review of Systems Per HPI    Objective:   Physical Exam  Nursing note and vitals reviewed. Constitutional: She appears well-developed and well-nourished. No distress.  HENT:  Head: Normocephalic and atraumatic.  Right Ear: Hearing, tympanic membrane, external ear and ear canal normal.  Left Ear: Hearing, tympanic membrane, external ear and ear canal normal.  Nose: No mucosal edema or rhinorrhea. Right sinus exhibits no maxillary sinus tenderness and no frontal sinus tenderness. Left sinus exhibits no maxillary sinus tenderness and no frontal sinus tenderness.  Mouth/Throat: Uvula is midline, oropharynx is clear and moist and mucous membranes are normal. No oropharyngeal exudate, posterior oropharyngeal edema, posterior oropharyngeal erythema or tonsillar abscesses.       Evidently congested  Eyes: Conjunctivae normal and EOM are normal. Pupils are equal, round, and reactive to light. No scleral icterus.  Neck: Normal range of motion. Neck supple.  Cardiovascular: Normal rate, regular rhythm, normal heart sounds and intact distal pulses.   No murmur heard. Pulmonary/Chest: Effort normal and breath sounds normal. No respiratory distress. She has no wheezes. She has no rales.  Lymphadenopathy:    She has no cervical adenopathy.  Skin: Skin is warm  and dry. No rash noted.       Assessment & Plan:

## 2012-04-16 NOTE — Patient Instructions (Signed)
Sounds like you have a viral upper respiratory infection. Antibiotics are not needed for this.  Viral infections usually take 7-10 days to resolve.  The cough can last a few weeks to go away. Use medication as prescribed:  cheratussin for cough at night. May use simple mucinex with plenty of fluid. Push fluids and plenty of rest. Please return if you are not improving as expected, or if you have high fevers (>101.5) or difficulty swallowing or worsening productive cough. Call clinic with questions.  Good to see you today.

## 2012-04-25 ENCOUNTER — Other Ambulatory Visit: Payer: Self-pay | Admitting: Family Medicine

## 2012-05-08 ENCOUNTER — Other Ambulatory Visit: Payer: Self-pay | Admitting: Family Medicine

## 2012-05-09 ENCOUNTER — Other Ambulatory Visit: Payer: Self-pay | Admitting: *Deleted

## 2012-05-09 MED ORDER — ALPRAZOLAM 0.25 MG PO TABS: 0.2500 mg | ORAL_TABLET | Freq: Three times a day (TID) | ORAL | Status: DC | PRN

## 2012-05-09 NOTE — Telephone Encounter (Signed)
Faxed refill request from [facility], last filled 04/06/12.

## 2012-05-10 NOTE — Telephone Encounter (Signed)
Medicine called to [facility].

## 2012-05-17 ENCOUNTER — Other Ambulatory Visit: Payer: Self-pay | Admitting: Family Medicine

## 2012-05-17 NOTE — Telephone Encounter (Signed)
Last filled 04/25/12 

## 2012-06-04 ENCOUNTER — Other Ambulatory Visit: Payer: Self-pay | Admitting: Family Medicine

## 2012-06-22 ENCOUNTER — Other Ambulatory Visit: Payer: Self-pay | Admitting: Family Medicine

## 2012-06-25 ENCOUNTER — Other Ambulatory Visit: Payer: Self-pay | Admitting: *Deleted

## 2012-06-25 MED ORDER — ALPRAZOLAM 0.25 MG PO TABS: 0.2500 mg | ORAL_TABLET | Freq: Three times a day (TID) | ORAL | Status: DC | PRN

## 2012-06-25 NOTE — Telephone Encounter (Signed)
Please call in

## 2012-06-26 NOTE — Telephone Encounter (Signed)
rx called in

## 2012-07-08 ENCOUNTER — Other Ambulatory Visit: Payer: Self-pay | Admitting: Family Medicine

## 2012-07-11 ENCOUNTER — Other Ambulatory Visit: Payer: PRIVATE HEALTH INSURANCE

## 2012-07-12 ENCOUNTER — Other Ambulatory Visit: Payer: Self-pay | Admitting: *Deleted

## 2012-07-12 DIAGNOSIS — G43909 Migraine, unspecified, not intractable, without status migrainosus: Secondary | ICD-10-CM

## 2012-07-12 MED ORDER — SUMATRIPTAN SUCCINATE 100 MG PO TABS: ORAL_TABLET | ORAL | Status: DC

## 2012-07-13 ENCOUNTER — Other Ambulatory Visit: Payer: Self-pay | Admitting: Family Medicine

## 2012-07-13 DIAGNOSIS — R7309 Other abnormal glucose: Secondary | ICD-10-CM

## 2012-07-13 DIAGNOSIS — Z0000 Encounter for general adult medical examination without abnormal findings: Secondary | ICD-10-CM

## 2012-07-13 DIAGNOSIS — E7800 Pure hypercholesterolemia, unspecified: Secondary | ICD-10-CM

## 2012-07-19 ENCOUNTER — Other Ambulatory Visit (INDEPENDENT_AMBULATORY_CARE_PROVIDER_SITE_OTHER): Payer: PRIVATE HEALTH INSURANCE

## 2012-07-19 DIAGNOSIS — R7309 Other abnormal glucose: Secondary | ICD-10-CM

## 2012-07-19 DIAGNOSIS — Z0000 Encounter for general adult medical examination without abnormal findings: Secondary | ICD-10-CM

## 2012-07-19 DIAGNOSIS — E7800 Pure hypercholesterolemia, unspecified: Secondary | ICD-10-CM

## 2012-07-19 LAB — LIPID PANEL
HDL: 38.5 mg/dL — ABNORMAL LOW (ref >39.00)
Total CHOL/HDL Ratio: 4
VLDL: 76.2 mg/dL — ABNORMAL HIGH (ref 0.0–40.0)

## 2012-07-19 LAB — COMPREHENSIVE METABOLIC PANEL
ALT: 48 U/L — ABNORMAL HIGH (ref 0–35)
AST: 36 U/L (ref 0–37)
Alkaline Phosphatase: 68 U/L (ref 39–117)
Sodium: 137 mEq/L (ref 135–145)
Total Bilirubin: 0.8 mg/dL (ref 0.3–1.2)
Total Protein: 7.4 g/dL (ref 6.0–8.3)

## 2012-07-19 LAB — LDL CHOLESTEROL, DIRECT: Direct LDL: 61 mg/dL

## 2012-07-25 ENCOUNTER — Other Ambulatory Visit (HOSPITAL_COMMUNITY)
Admission: RE | Admit: 2012-07-25 | Discharge: 2012-07-25 | Disposition: A | Discharge status: Home / Self Care | Payer: PRIVATE HEALTH INSURANCE | Source: Ambulatory Visit | Attending: Family Medicine | Admitting: Family Medicine

## 2012-07-25 ENCOUNTER — Ambulatory Visit (INDEPENDENT_AMBULATORY_CARE_PROVIDER_SITE_OTHER): Payer: PRIVATE HEALTH INSURANCE | Admitting: Family Medicine

## 2012-07-25 VITALS — BP 130/92 | HR 88 | Temp 98.4°F | Ht 63.75 in | Wt 158.0 lb

## 2012-07-25 DIAGNOSIS — Z87898 Personal history of other specified conditions: Secondary | ICD-10-CM

## 2012-07-25 DIAGNOSIS — E7800 Pure hypercholesterolemia, unspecified: Secondary | ICD-10-CM

## 2012-07-25 DIAGNOSIS — Z23 Encounter for immunization: Secondary | ICD-10-CM

## 2012-07-25 DIAGNOSIS — IMO0002 Reserved for concepts with insufficient information to code with codable children: Secondary | ICD-10-CM

## 2012-07-25 DIAGNOSIS — E118 Type 2 diabetes mellitus with unspecified complications: Secondary | ICD-10-CM | POA: Insufficient documentation

## 2012-07-25 DIAGNOSIS — Z0000 Encounter for general adult medical examination without abnormal findings: Secondary | ICD-10-CM

## 2012-07-25 DIAGNOSIS — E1165 Type 2 diabetes mellitus with hyperglycemia: Secondary | ICD-10-CM

## 2012-07-25 DIAGNOSIS — IMO0001 Reserved for inherently not codable concepts without codable children: Secondary | ICD-10-CM

## 2012-07-25 DIAGNOSIS — Z1151 Encounter for screening for human papillomavirus (HPV): Secondary | ICD-10-CM | POA: Insufficient documentation

## 2012-07-25 DIAGNOSIS — Z01419 Encounter for gynecological examination (general) (routine) without abnormal findings: Secondary | ICD-10-CM | POA: Insufficient documentation

## 2012-07-25 MED ORDER — GLYBURIDE-METFORMIN 1.25-250 MG PO TABS: 1.0000 | ORAL_TABLET | Freq: Two times a day (BID) | ORAL | Status: DC

## 2012-07-25 MED ORDER — GLYBURIDE-METFORMIN 2.5-500 MG PO TABS: 1.0000 | ORAL_TABLET | Freq: Every day | ORAL | Status: DC

## 2012-07-25 MED ORDER — GLYBURIDE-METFORMIN 2.5-500 MG PO TABS: 1.0000 | ORAL_TABLET | Freq: Two times a day (BID) | ORAL | Status: DC

## 2012-07-25 MED ORDER — GLUCOSE BLOOD VI STRP: ORAL_STRIP | Status: DC

## 2012-07-25 NOTE — Progress Notes (Signed)
Subjective:    Patient ID: Janice Greene, female    DOB: [birth year], 47 y.o.   MRN: [id number]  HPI  47 yo here for CPX.  Had been seeing Dr. Elana Alm, OBGYN until he retired.  Last pap in 10/2010.  No h/o abnormal pap smears. Mammogram normal in 01/2012.  HLD-  Lab Results  Component Value Date   CHOL 154 07/19/2012   HDL 38.50* 07/19/2012   LDLCALC 50 11/05/2009   LDLDIRECT 61.0 07/19/2012   TRIG 381.0* 07/19/2012   CHOLHDL 4 07/19/2012    On Trilipix 135 mg daily upsets her stomach.  Often cannot take it.  Taking her Zocor as well.  Migraines- has been doing better with Atenolol 35 mg daily.  Uses IMitrex as needed.  Had one a few weeks ago but under a lot of stress. Her mother is going into a nursing home.  DM-a1c has been slowly increasing.  Up to 8.1 this month.   On Metformin 500 mg twice daily but often only takes it once a day because of stomach cramping- no nausea, diarrhea or vomiting.  Denies any symptoms of hypo or hyperglycemia.  Also admits to not being very compliant with diet.  Lab Results  Component Value Date   HGBA1C 8.1* 07/19/2012   Wt Readings from Last 3 Encounters:  04/16/12 162 lb 4 oz (73.596 kg)  08/02/11 158 lb 6.4 oz (71.85 kg)  06/08/10 162 lb 8 oz (73.71 kg)     Review of Systems See HPI Patient reports no  vision/ hearing changes,anorexia, weight change, fever ,adenopathy, persistant / recurrent hoarseness, swallowing issues, chest pain, edema,persistant / recurrent cough, hemoptysis, dyspnea(rest, exertional, paroxysmal nocturnal), gastrointestinal  bleeding (melena, rectal bleeding), abdominal pain, excessive heart burn, GU symptoms(dysuria, hematuria, pyuria, voiding/incontinence  Issues) syncope, focal weakness, severe memory loss, concerning skin lesions, depression, anxiety, abnormal bruising/bleeding, major joint swelling, breast masses or abnormal vaginal bleeding.       Objective:   Physical Exam BP 130/92  Pulse 88  Temp 98.4 F  (36.9 C)  Ht 5' 3.75" (1.619 m)  Wt 158 lb (71.668 kg)  BMI 27.33 kg/m2   General:  Well-developed,well-nourished,in no acute distress; alert,appropriate and cooperative throughout examination Head:  normocephalic and atraumatic.   Eyes:  vision grossly intact, pupils equal, pupils round, and pupils reactive to light.   Ears:  R ear normal and L ear normal.   Nose:  no external deformity.   Mouth:  good dentition.   Neck:  No deformities, masses, or tenderness noted. Breasts:  No mass, nodules, thickening, tenderness, bulging, retraction, inflamation, nipple discharge or skin changes noted.   Lungs:  Normal respiratory effort, chest expands symmetrically. Lungs are clear to auscultation, no crackles or wheezes. Heart:  Normal rate and regular rhythm. S1 and S2 normal without gallop, murmur, click, rub or other extra sounds. Abdomen:  Bowel sounds positive,abdomen soft and non-tender without masses, organomegaly or hernias noted. Rectal:  no external abnormalities.   Genitalia:  Pelvic Exam:        External: normal female genitalia without lesions or masses        Vagina: normal without lesions or masses        Cervix: normal without lesions or masses        Adnexa: normal bimanual exam without masses or fullness        Uterus: normal by palpation        Pap smear: performed Msk:  No deformity or scoliosis noted of  thoracic or lumbar spine.   Extremities:  No clubbing, cyanosis, edema, or deformity noted with normal full range of motion of all joints.   Neurologic:  alert & oriented X3 and gait normal.   Skin:  Intact without suspicious lesions or rashes Cervical Nodes:  No lymphadenopathy noted Axillary Nodes:  No palpable lymphadenopathy Psych:  Cognition and judgment appear intact. Alert and cooperative with normal attention span and concentration. No apparent delusions, illusions, hallucinations      Assessment & Plan:   1. Pure hypercholesterolemia  TG deteriorated but  likely due to deteriorated DM. See below.  2. Routine general medical examination at a health care facility  Reviewed preventive care protocols, scheduled due services, and updated immunizations Discussed nutrition, exercise, diet, and healthy lifestyle. Pap smear, influenza vaccination today  3. MIGRAINES, HX OF  Stable.  4. Diabetes mellitus type II, uncontrolled  Deteriorated.  She is willing to take Metformin 500 mg twice daily again.  Will also add glyburide so d/c metformin and start Glucovance 2.5-500 mg twice daily. Follow up a1c in 3 months.

## 2012-07-25 NOTE — Addendum Note (Signed)
Addended by: Eliezer Bottom on: 07/25/2012 08:57 AM   Modules accepted: Orders

## 2012-07-25 NOTE — Patient Instructions (Addendum)
Good to see you. We are STOPPING your metformin and starting Glucovance 1 tablet twice daily. Please check your blood sugars a few times a week and call me with an update.  Please come back in 3 months for repeat a1c.

## 2012-07-31 ENCOUNTER — Encounter: Payer: Self-pay | Admitting: *Deleted

## 2012-07-31 ENCOUNTER — Encounter: Payer: Self-pay | Admitting: Family Medicine

## 2012-08-22 ENCOUNTER — Other Ambulatory Visit: Payer: Self-pay | Admitting: Family Medicine

## 2012-08-23 ENCOUNTER — Other Ambulatory Visit: Payer: Self-pay | Admitting: *Deleted

## 2012-08-23 MED ORDER — ALPRAZOLAM 0.25 MG PO TABS: 0.2500 mg | ORAL_TABLET | Freq: Three times a day (TID) | ORAL | Status: DC | PRN

## 2012-08-23 NOTE — Telephone Encounter (Signed)
Last filled 06/26/2012

## 2012-08-24 NOTE — Telephone Encounter (Signed)
Med[facility] to [facility] 

## 2012-08-28 ENCOUNTER — Other Ambulatory Visit: Payer: Self-pay | Admitting: Family Medicine

## 2012-08-30 ENCOUNTER — Other Ambulatory Visit: Payer: Self-pay | Admitting: Family Medicine

## 2012-09-23 ENCOUNTER — Other Ambulatory Visit: Payer: Self-pay | Admitting: Family Medicine

## 2012-10-02 ENCOUNTER — Ambulatory Visit (INDEPENDENT_AMBULATORY_CARE_PROVIDER_SITE_OTHER): Payer: PRIVATE HEALTH INSURANCE | Admitting: Family Medicine

## 2012-10-02 ENCOUNTER — Encounter: Payer: Self-pay | Admitting: Family Medicine

## 2012-10-02 VITALS — BP 122/80 | HR 68 | Temp 97.8°F | Wt 156.0 lb

## 2012-10-02 DIAGNOSIS — Z87898 Personal history of other specified conditions: Secondary | ICD-10-CM

## 2012-10-02 MED ORDER — GABAPENTIN 600 MG PO TABS: 600.0000 mg | ORAL_TABLET | Freq: Three times a day (TID) | ORAL | Status: DC

## 2012-10-02 MED ORDER — ALPRAZOLAM 0.25 MG PO TABS: 0.2500 mg | ORAL_TABLET | Freq: Three times a day (TID) | ORAL | Status: DC | PRN

## 2012-10-02 MED ORDER — CYCLOBENZAPRINE HCL 10 MG PO TABS: ORAL_TABLET | ORAL | Status: DC

## 2012-10-02 MED ORDER — TOPIRAMATE 50 MG PO TABS: 50.0000 mg | ORAL_TABLET | Freq: Every day | ORAL | Status: DC
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
## 2012-10-02 NOTE — Patient Instructions (Addendum)
Good to see you. We are increasing your gabapentin to 600 mg three times daily. We are restarting Topamax.  Keep me posted.

## 2012-10-09 ENCOUNTER — Other Ambulatory Visit: Payer: Self-pay | Admitting: Family Medicine

## 2012-10-26 ENCOUNTER — Other Ambulatory Visit: Payer: Self-pay | Admitting: Family Medicine

## 2012-10-31 ENCOUNTER — Other Ambulatory Visit: Payer: Self-pay | Admitting: Family Medicine

## 2012-11-22 ENCOUNTER — Other Ambulatory Visit: Payer: Self-pay | Admitting: Family Medicine

## 2012-12-04 ENCOUNTER — Other Ambulatory Visit: Payer: Self-pay | Admitting: Family Medicine

## 2012-12-05 ENCOUNTER — Encounter: Payer: Self-pay | Admitting: *Deleted

## 2012-12-05 NOTE — Telephone Encounter (Signed)
rx already requested electronically from pharmacy

## 2012-12-11 ENCOUNTER — Other Ambulatory Visit: Payer: Self-pay | Admitting: Family Medicine

## 2012-12-11 NOTE — Telephone Encounter (Signed)
Advised patient that imitrex has been sent to her pharmacy but that she will need to check with her insurance or her pharmacy regarding whether early refill will be covered.

## 2012-12-11 NOTE — Telephone Encounter (Signed)
Pt left v/m going on vacation and requesting Imitrex few days early; pt wants call back when med called in and to know that ins will cover early request.

## 2013-01-01 ENCOUNTER — Other Ambulatory Visit: Payer: Self-pay | Admitting: Family Medicine

## 2013-01-09 ENCOUNTER — Other Ambulatory Visit: Payer: Self-pay | Admitting: Family Medicine

## 2013-01-10 ENCOUNTER — Other Ambulatory Visit: Payer: Self-pay | Admitting: Family Medicine

## 2013-01-30 ENCOUNTER — Other Ambulatory Visit: Payer: Self-pay | Admitting: Family Medicine

## 2013-02-04 ENCOUNTER — Other Ambulatory Visit: Payer: Self-pay | Admitting: Family Medicine

## 2013-02-05 ENCOUNTER — Other Ambulatory Visit: Payer: Self-pay | Admitting: Family Medicine

## 2013-02-11 ENCOUNTER — Other Ambulatory Visit: Payer: Self-pay

## 2013-02-11 DIAGNOSIS — Z1231 Encounter for screening mammogram for malignant neoplasm of breast: Secondary | ICD-10-CM

## 2013-02-26 ENCOUNTER — Ambulatory Visit
Admission: RE | Admit: 2013-02-26 | Discharge: 2013-02-26 | Disposition: A | Discharge status: Home / Self Care | Payer: PRIVATE HEALTH INSURANCE | Source: Ambulatory Visit

## 2013-02-26 ENCOUNTER — Other Ambulatory Visit: Payer: Self-pay | Admitting: Family Medicine

## 2013-02-26 DIAGNOSIS — Z1231 Encounter for screening mammogram for malignant neoplasm of breast: Secondary | ICD-10-CM

## 2013-02-27 NOTE — Telephone Encounter (Signed)
Alprazolam refill called to [facility]. 

## 2013-02-28 ENCOUNTER — Other Ambulatory Visit: Payer: Self-pay | Admitting: Family Medicine

## 2013-03-01 NOTE — Telephone Encounter (Signed)
Is this ok to refill? Pt's migraine meds were adjusted on 10/02/12 OV.

## 2013-03-24 ENCOUNTER — Other Ambulatory Visit: Payer: Self-pay | Admitting: Family Medicine

## 2013-04-01 ENCOUNTER — Other Ambulatory Visit: Payer: Self-pay | Admitting: Family Medicine

## 2013-04-01 NOTE — Telephone Encounter (Signed)
Alprazolam phoned in to pharmacy.

## 2013-04-17 ENCOUNTER — Other Ambulatory Visit: Payer: Self-pay | Admitting: Family Medicine

## 2013-04-18 NOTE — Telephone Encounter (Signed)
Electronic refill request, please advise  

## 2013-05-02 ENCOUNTER — Other Ambulatory Visit: Payer: Self-pay | Admitting: Family Medicine

## 2013-05-03 NOTE — Telephone Encounter (Signed)
Ok to refill-phone in please 

## 2013-05-03 NOTE — Telephone Encounter (Signed)
Alprazolam called to [facility]. 

## 2013-05-03 NOTE — Telephone Encounter (Signed)
Last office visit 10/02/2012.  Ok to refill? 

## 2013-05-13 ENCOUNTER — Other Ambulatory Visit: Payer: Self-pay | Admitting: Family Medicine

## 2013-05-13 NOTE — Telephone Encounter (Signed)
Last office visit 10/02/2012.  Ok to refill? 

## 2013-06-02 ENCOUNTER — Other Ambulatory Visit: Payer: Self-pay | Admitting: Internal Medicine

## 2013-06-03 ENCOUNTER — Other Ambulatory Visit: Payer: Self-pay | Admitting: Internal Medicine

## 2013-06-03 NOTE — Telephone Encounter (Signed)
Ok to phone in.

## 2013-06-04 NOTE — Telephone Encounter (Signed)
Phoned in to pharmacy. 

## 2013-06-05 ENCOUNTER — Other Ambulatory Visit: Payer: Self-pay | Admitting: Family Medicine

## 2013-06-05 NOTE — Telephone Encounter (Signed)
Last office visit 10/02/2012.  Ok to refill? 

## 2013-06-24 ENCOUNTER — Other Ambulatory Visit: Payer: Self-pay | Admitting: Internal Medicine

## 2013-07-02 ENCOUNTER — Other Ambulatory Visit: Payer: Self-pay | Admitting: Internal Medicine

## 2013-07-03 MED ORDER — CYCLOBENZAPRINE HCL 10 MG PO TABS: 10.0000 mg | ORAL_TABLET | Freq: Every day | ORAL | Status: DC

## 2013-07-03 NOTE — Telephone Encounter (Signed)
Ok to refill one time only, no refills.

## 2013-07-03 NOTE — Telephone Encounter (Signed)
Xanax was called into pharmacy and Flexeril e-scribed as directed

## 2013-07-03 NOTE — Telephone Encounter (Signed)
Dr Aron pt 

## 2013-07-03 NOTE — Telephone Encounter (Signed)
Last filled 06/03/13--please advise 

## 2013-07-03 NOTE — Addendum Note (Signed)
Addended by: Roena Malady on: 07/03/2013 01:46 PM   Modules accepted: Orders

## 2013-07-30 ENCOUNTER — Other Ambulatory Visit: Payer: Self-pay | Admitting: Family Medicine

## 2013-07-30 ENCOUNTER — Other Ambulatory Visit: Payer: Self-pay | Admitting: Internal Medicine

## 2013-07-31 NOTE — Telephone Encounter (Signed)
Spoke to pt and informed her Rx has been sent to requested pharmacy 

## 2013-07-31 NOTE — Telephone Encounter (Signed)
Xanax called in 

## 2013-07-31 NOTE — Telephone Encounter (Signed)
Pt requesting medication refill. Last ov 10/2012 with no future appts scheduled. Last Rx refill given 06/2013. pls advise.

## 2013-07-31 NOTE — Telephone Encounter (Signed)
Last filled 06/24/13 by Nicki Reaperegina Baity, however pt has not had an OV since 4/14--please advise

## 2013-07-31 NOTE — Telephone Encounter (Signed)
Last office visit 10/08/2012.  Ok to refill? 

## 2013-08-26 ENCOUNTER — Other Ambulatory Visit: Payer: Self-pay | Admitting: Family Medicine

## 2013-08-27 NOTE — Telephone Encounter (Signed)
Last office visit 10/02/2012.  Ok to refill? 

## 2013-08-27 NOTE — Telephone Encounter (Signed)
Alprazolam called to [facility].

## 2013-09-03 ENCOUNTER — Encounter: Payer: Self-pay | Admitting: Radiology

## 2013-09-04 ENCOUNTER — Other Ambulatory Visit: Payer: Self-pay | Admitting: Family Medicine

## 2013-09-04 ENCOUNTER — Ambulatory Visit (INDEPENDENT_AMBULATORY_CARE_PROVIDER_SITE_OTHER): Payer: PRIVATE HEALTH INSURANCE | Admitting: Family Medicine

## 2013-09-04 ENCOUNTER — Encounter: Payer: Self-pay | Admitting: Family Medicine

## 2013-09-04 VITALS — BP 128/78 | HR 94 | Temp 97.9°F | Ht 63.0 in | Wt 158.5 lb

## 2013-09-04 DIAGNOSIS — E7800 Pure hypercholesterolemia, unspecified: Secondary | ICD-10-CM

## 2013-09-04 DIAGNOSIS — IMO0002 Reserved for concepts with insufficient information to code with codable children: Secondary | ICD-10-CM

## 2013-09-04 DIAGNOSIS — F4323 Adjustment disorder with mixed anxiety and depressed mood: Secondary | ICD-10-CM

## 2013-09-04 DIAGNOSIS — F411 Generalized anxiety disorder: Secondary | ICD-10-CM

## 2013-09-04 DIAGNOSIS — IMO0001 Reserved for inherently not codable concepts without codable children: Secondary | ICD-10-CM

## 2013-09-04 DIAGNOSIS — E1165 Type 2 diabetes mellitus with hyperglycemia: Secondary | ICD-10-CM

## 2013-09-04 LAB — COMPREHENSIVE METABOLIC PANEL
ALK PHOS: 55 U/L (ref 39–117)
ALT: 29 U/L (ref 0–35)
AST: 21 U/L (ref 0–37)
Albumin: 4.1 g/dL (ref 3.5–5.2)
BILIRUBIN TOTAL: 0.8 mg/dL (ref 0.3–1.2)
BUN: 16 mg/dL (ref 6–23)
CO2: 27 mEq/L (ref 19–32)
Calcium: 9.3 mg/dL (ref 8.4–10.5)
Chloride: 106 mEq/L (ref 96–112)
Creatinine, Ser: 1.1 mg/dL (ref 0.4–1.2)
GFR: 58.4 mL/min — ABNORMAL LOW (ref >60.00)
Glucose, Bld: 124 mg/dL — ABNORMAL HIGH (ref 70–99)
POTASSIUM: 4.5 meq/L (ref 3.5–5.1)
SODIUM: 140 meq/L (ref 135–145)
TOTAL PROTEIN: 7.2 g/dL (ref 6.0–8.3)

## 2013-09-04 LAB — LIPID PANEL
CHOL/HDL RATIO: 4
Cholesterol: 145 mg/dL (ref 0–200)
HDL: 38.2 mg/dL — ABNORMAL LOW (ref >39.00)
LDL CALC: 53 mg/dL (ref 0–99)
Triglycerides: 270 mg/dL — ABNORMAL HIGH (ref 0.0–149.0)
VLDL: 54 mg/dL — AB (ref 0.0–40.0)

## 2013-09-04 LAB — HEMOGLOBIN A1C: Hgb A1c MFr Bld: 6.4 % (ref 4.6–6.5)

## 2013-09-04 MED ORDER — TOPIRAMATE 100 MG PO TABS: ORAL_TABLET | ORAL | Status: DC

## 2013-09-04 MED ORDER — SERTRALINE HCL 25 MG PO TABS: 25.0000 mg | ORAL_TABLET | Freq: Every day | ORAL | Status: DC

## 2013-09-04 NOTE — Assessment & Plan Note (Signed)
Overdue for a1c. Recheck labs today.

## 2013-09-04 NOTE — Assessment & Plan Note (Signed)
Deteriorated.  She is declining psychotherapy at this time.  Wants to only stay on xanax but I explained to her that this is not a good maintenance medication. Will start Zoloft 25 mg daily. She will call in a few weeks with an update.

## 2013-09-04 NOTE — Progress Notes (Signed)
Pre visit review using our clinic review tool, if applicable. No additional management support is needed unless otherwise documented below in the visit note. 
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
## 2013-09-04 NOTE — Patient Instructions (Signed)
Great to see you. We are starting Zoloft 25 mg daily.  Please call me in a few weeks with an update.  We are increasing your topamax to 100 mg nightly.

## 2013-09-04 NOTE — Assessment & Plan Note (Signed)
Check labs today.

## 2013-09-05 ENCOUNTER — Telehealth: Payer: Self-pay

## 2013-09-05 NOTE — Telephone Encounter (Signed)
Relevant patient education assigned to patient using Emmi. ° °

## 2013-09-06 ENCOUNTER — Telehealth: Payer: Self-pay

## 2013-09-06 NOTE — Telephone Encounter (Signed)
Labs, particularly a1c (blood sugar over 3 months), much better.  Great work!  No changes in glucovance dose.

## 2013-09-06 NOTE — Telephone Encounter (Signed)
Pt left v/m; pt request cb on recent lab results; pt does not have appt scheduled until 12/05/13.

## 2013-09-06 NOTE — Telephone Encounter (Signed)
Lm on pts vm requesting a call back 

## 2013-09-09 NOTE — Telephone Encounter (Signed)
Spoke to pt and informed her of results.  

## 2013-09-20 ENCOUNTER — Other Ambulatory Visit: Payer: Self-pay | Admitting: Family Medicine

## 2013-09-30 ENCOUNTER — Other Ambulatory Visit: Payer: Self-pay | Admitting: Family Medicine

## 2013-09-30 NOTE — Telephone Encounter (Signed)
Lm on pts vm informing her Rx has been faxed to requested pharmacy.  

## 2013-09-30 NOTE — Telephone Encounter (Signed)
Pt requesting medication refill. Last ov 09/2013 with f/u appt 12/2013. pls advise

## 2013-10-02 ENCOUNTER — Other Ambulatory Visit: Payer: Self-pay | Admitting: Family Medicine

## 2013-10-13 ENCOUNTER — Other Ambulatory Visit: Payer: Self-pay | Admitting: Family Medicine

## 2013-10-16 ENCOUNTER — Other Ambulatory Visit: Payer: Self-pay | Admitting: Family Medicine

## 2013-11-03 ENCOUNTER — Telehealth: Payer: Self-pay | Admitting: Family Medicine

## 2013-11-04 ENCOUNTER — Other Ambulatory Visit: Payer: Self-pay | Admitting: Family Medicine

## 2013-11-04 NOTE — Telephone Encounter (Signed)
Spoke to Maralyn SagoSarah at [facility] and was advised that she did receive the faxed script for Alprazolam and she has 3 scripts there for her to pickup. Called patient and advised her of this.

## 2013-11-04 NOTE — Telephone Encounter (Signed)
Pt left v/m; when pt went to [facility] [address] 2 of pts med were ready for pick up but alprazolam was not there. Pt request cb about alprazolam refill.

## 2013-11-04 NOTE — Telephone Encounter (Signed)
Last office visit 09/04/13. Refill?

## 2013-11-04 NOTE — Telephone Encounter (Signed)
Signed script faxed to pharmacy.

## 2013-11-08 ENCOUNTER — Other Ambulatory Visit: Payer: Self-pay | Admitting: Family Medicine

## 2013-11-30 ENCOUNTER — Other Ambulatory Visit: Payer: Self-pay | Admitting: Family Medicine

## 2013-12-04 ENCOUNTER — Other Ambulatory Visit: Payer: Self-pay | Admitting: Family Medicine

## 2013-12-05 ENCOUNTER — Ambulatory Visit: Payer: PRIVATE HEALTH INSURANCE | Admitting: Family Medicine

## 2013-12-06 NOTE — Telephone Encounter (Signed)
Pt requesting medication refill. Last f/u appt 07/2012-CPE with f/u appt 01/2014.Pt canceled last f/u appt. pls advise

## 2013-12-06 NOTE — Telephone Encounter (Signed)
Lm on pts vm informing her Rx has been called in to requested pharmacy 

## 2013-12-09 ENCOUNTER — Other Ambulatory Visit: Payer: Self-pay | Admitting: Family Medicine

## 2014-01-05 ENCOUNTER — Other Ambulatory Visit: Payer: Self-pay | Admitting: Family Medicine

## 2014-01-06 NOTE — Telephone Encounter (Signed)
Pt requesting medication refill. Last f/u appt 12/2013 with 01/2014 appt scheduled. pls advise

## 2014-01-06 NOTE — Telephone Encounter (Signed)
Lm on pts vm informing her Rx has been faxed to requested pharmacy.  

## 2014-01-09 ENCOUNTER — Other Ambulatory Visit: Payer: Self-pay | Admitting: Family Medicine

## 2014-01-09 ENCOUNTER — Ambulatory Visit: Payer: PRIVATE HEALTH INSURANCE | Admitting: Family Medicine

## 2014-01-13 ENCOUNTER — Other Ambulatory Visit: Payer: Self-pay

## 2014-01-13 MED ORDER — TOPIRAMATE 100 MG PO TABS: ORAL_TABLET | ORAL | Status: DC

## 2014-01-13 NOTE — Telephone Encounter (Signed)
Pt left v/m requesting refill topamax to [facility] for h/a prevention; pt already has appt scheduled for 02/07/14. Pt request cb when refilled.

## 2014-01-30 ENCOUNTER — Other Ambulatory Visit: Payer: Self-pay | Admitting: Family Medicine

## 2014-01-30 ENCOUNTER — Telehealth: Payer: Self-pay | Admitting: *Deleted

## 2014-01-30 MED ORDER — SUMATRIPTAN SUCCINATE 100 MG PO TABS: ORAL_TABLET | ORAL | Status: DC

## 2014-01-30 NOTE — Telephone Encounter (Signed)
Spoke to pt who states that she is not wanting a referral at this time. She states that she has an appt on 02/07/14 and would like to discuss further before seeing neuro. Per Dr Dayton MartesAron, ok to fill until upcoming appt

## 2014-01-30 NOTE — Telephone Encounter (Signed)
Pt left message with Triage: Pt called and wanted to know why her Sumatriptan refill request was declined this morning, pt request call back

## 2014-01-30 NOTE — Telephone Encounter (Signed)
Please call to follow up with pt- it was declined because she should not be taking more than 9 per month.  May need neuro referral and to refill until she can be seen.

## 2014-02-03 ENCOUNTER — Other Ambulatory Visit: Payer: Self-pay | Admitting: Family Medicine

## 2014-02-04 NOTE — Telephone Encounter (Signed)
Pt has upcoming appt 02/07/14 and will fill at that time. Pt was previously informed that she is needing a f/u appt for additional refills. She will schedule appt, but then cancel after Rx are sent to the pharmacy

## 2014-02-07 ENCOUNTER — Encounter: Payer: Self-pay | Admitting: Family Medicine

## 2014-02-07 ENCOUNTER — Ambulatory Visit (INDEPENDENT_AMBULATORY_CARE_PROVIDER_SITE_OTHER): Payer: PRIVATE HEALTH INSURANCE | Admitting: Family Medicine

## 2014-02-07 VITALS — BP 124/74 | HR 80 | Temp 97.7°F | Ht 63.5 in | Wt 154.0 lb

## 2014-02-07 DIAGNOSIS — E1165 Type 2 diabetes mellitus with hyperglycemia: Secondary | ICD-10-CM

## 2014-02-07 DIAGNOSIS — IMO0001 Reserved for inherently not codable concepts without codable children: Secondary | ICD-10-CM

## 2014-02-07 DIAGNOSIS — E7800 Pure hypercholesterolemia, unspecified: Secondary | ICD-10-CM

## 2014-02-07 DIAGNOSIS — IMO0002 Reserved for concepts with insufficient information to code with codable children: Secondary | ICD-10-CM

## 2014-02-07 DIAGNOSIS — F411 Generalized anxiety disorder: Secondary | ICD-10-CM

## 2014-02-07 DIAGNOSIS — R7989 Other specified abnormal findings of blood chemistry: Secondary | ICD-10-CM

## 2014-02-07 LAB — COMPREHENSIVE METABOLIC PANEL
ALT: 27 U/L (ref 0–35)
AST: 24 U/L (ref 0–37)
Albumin: 4.3 g/dL (ref 3.5–5.2)
Alkaline Phosphatase: 50 U/L (ref 39–117)
BILIRUBIN TOTAL: 0.6 mg/dL (ref 0.2–1.2)
BUN: 24 mg/dL — AB (ref 6–23)
CO2: 23 mEq/L (ref 19–32)
Calcium: 9.6 mg/dL (ref 8.4–10.5)
Chloride: 108 mEq/L (ref 96–112)
Creatinine, Ser: 1.2 mg/dL (ref 0.4–1.2)
GFR: 48.65 mL/min — ABNORMAL LOW (ref >60.00)
GLUCOSE: 145 mg/dL — AB (ref 70–99)
Potassium: 3.8 mEq/L (ref 3.5–5.1)
SODIUM: 140 meq/L (ref 135–145)
TOTAL PROTEIN: 7.7 g/dL (ref 6.0–8.3)

## 2014-02-07 LAB — LDL CHOLESTEROL, DIRECT: Direct LDL: 90.9 mg/dL

## 2014-02-07 LAB — HEMOGLOBIN A1C: HEMOGLOBIN A1C: 6.5 % (ref 4.6–6.5)

## 2014-02-07 LAB — LIPID PANEL
Cholesterol: 156 mg/dL (ref 0–200)
HDL: 33.6 mg/dL — ABNORMAL LOW (ref >39.00)
NONHDL: 122.4
Total CHOL/HDL Ratio: 5
Triglycerides: 276 mg/dL — ABNORMAL HIGH (ref 0.0–149.0)
VLDL: 55.2 mg/dL — ABNORMAL HIGH (ref 0.0–40.0)

## 2014-02-07 MED ORDER — CITALOPRAM HYDROBROMIDE 20 MG PO TABS: ORAL_TABLET | ORAL | Status: DC

## 2014-02-07 MED ORDER — ALPRAZOLAM 0.25 MG PO TABS: ORAL_TABLET | ORAL | Status: DC

## 2014-02-07 NOTE — Progress Notes (Signed)
Pre visit review using our clinic review tool, if applicable. No additional management support is needed unless otherwise documented below in the visit note. 
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
## 2014-02-07 NOTE — Patient Instructions (Signed)
Good to see you. We are starting celexa 20 mg daily- ok to start out taking 1/2 tablet for 1 week while you are getting used to it.  We will call you with your eye doctor appointment.

## 2014-02-07 NOTE — Assessment & Plan Note (Signed)
At goal for diabetic. Recheck labs today.

## 2014-02-07 NOTE — Assessment & Plan Note (Signed)
Deteriorated. Could not tolerate zoloft. Discussed tx options- she is agreeable to starting celexa- will start out with 10 mg daily, ok to increase to 20 mg daily after 1 week if well tolerated.

## 2014-02-07 NOTE — Assessment & Plan Note (Addendum)
Deteriorated. Was at goal when we last checked her a1c but she does not check her FSBS regularly.  She does admit to not eating as well has concerns- likely deteriorated. a1c today. Will also refer to optho for eye exam. Urine micro today as well.

## 2014-02-10 ENCOUNTER — Encounter: Payer: Self-pay | Admitting: *Deleted

## 2014-02-10 NOTE — Addendum Note (Signed)
Addended by: Alvina ChouWALSH, Coen Miyasato J on: 02/10/2014 11:16 AM   Modules accepted: Orders

## 2014-02-12 ENCOUNTER — Other Ambulatory Visit: Payer: PRIVATE HEALTH INSURANCE | Admitting: Family Medicine

## 2014-02-12 DIAGNOSIS — E1165 Type 2 diabetes mellitus with hyperglycemia: Secondary | ICD-10-CM

## 2014-02-12 DIAGNOSIS — IMO0002 Reserved for concepts with insufficient information to code with codable children: Secondary | ICD-10-CM

## 2014-02-12 LAB — MICROALBUMIN / CREATININE URINE RATIO
Creatinine,U: 160.8 mg/dL
Microalb Creat Ratio: 0.6 mg/g (ref 0.0–30.0)
Microalb, Ur: 0.9 mg/dL (ref 0.0–1.9)

## 2014-02-12 NOTE — Addendum Note (Signed)
Addended by: Baldomero LamyHAVERS, NATASHA C on: 02/12/2014 09:53 AM   Modules accepted: Orders

## 2014-02-18 ENCOUNTER — Other Ambulatory Visit: Payer: Self-pay | Admitting: Family Medicine

## 2014-02-19 NOTE — Telephone Encounter (Signed)
Pt called for status of refills; spoke with Morrie SheldonAshley at [facility] and rxs will be ready for pick up in about 1 hr. Pt voiced understanding.

## 2014-03-03 ENCOUNTER — Other Ambulatory Visit: Payer: Self-pay | Admitting: Family Medicine

## 2014-03-10 ENCOUNTER — Other Ambulatory Visit: Payer: Self-pay | Admitting: Family Medicine

## 2014-03-11 NOTE — Telephone Encounter (Signed)
Last filled 02/07/2014 

## 2014-03-11 NOTE — Telephone Encounter (Signed)
Alprazolam phoned in to pharmacy.

## 2014-04-02 ENCOUNTER — Other Ambulatory Visit: Payer: Self-pay | Admitting: *Deleted

## 2014-04-02 ENCOUNTER — Telehealth: Payer: Self-pay | Admitting: *Deleted

## 2014-04-02 MED ORDER — SUMATRIPTAN SUCCINATE 100 MG PO TABS: ORAL_TABLET | ORAL | Status: DC

## 2014-04-02 NOTE — Telephone Encounter (Signed)
Pt states that she recently had an eye appt, as suggested for DM, with [facility] and report was normal. Pt also states that she has recently been having neck pain and has an upcoming appt with a chiropractor. Pt only wanting to advise Dr Dayton MartesAron as the chiropractor is hoping that this will help with her migraines.

## 2014-04-07 ENCOUNTER — Other Ambulatory Visit: Payer: Self-pay | Admitting: Family Medicine

## 2014-04-16 ENCOUNTER — Other Ambulatory Visit: Payer: Self-pay | Admitting: Family Medicine

## 2014-04-17 NOTE — Telephone Encounter (Signed)
Pt requesting medication refill. Last f/u appt 02/2014. Ok to fill per Dr Aron. Rx to be faxed to requested pharmacy before end of day, today.  

## 2014-04-28 ENCOUNTER — Other Ambulatory Visit: Payer: Self-pay | Admitting: Family Medicine

## 2014-04-28 ENCOUNTER — Ambulatory Visit: Payer: PRIVATE HEALTH INSURANCE | Admitting: Family Medicine

## 2014-05-15 ENCOUNTER — Other Ambulatory Visit: Payer: Self-pay | Admitting: Family Medicine

## 2014-05-16 NOTE — Telephone Encounter (Signed)
Pt requesting medication refill. Last f/u appt 01/2014. pls advise 

## 2014-05-16 NOTE — Telephone Encounter (Signed)
Rx called in to requested pharmacy 

## 2014-06-05 ENCOUNTER — Other Ambulatory Visit: Payer: Self-pay | Admitting: Family Medicine

## 2014-06-20 ENCOUNTER — Other Ambulatory Visit: Payer: Self-pay | Admitting: Family Medicine

## 2014-06-20 NOTE — Telephone Encounter (Signed)
Rx called in to requested pharmacy 

## 2014-06-20 NOTE — Telephone Encounter (Signed)
Pt requesting medication refill. Last f/u appt 02/2014. pls advise 

## 2014-06-24 ENCOUNTER — Other Ambulatory Visit: Payer: Self-pay | Admitting: Family Medicine

## 2014-07-01 ENCOUNTER — Other Ambulatory Visit: Payer: Self-pay | Admitting: Family Medicine

## 2014-07-01 NOTE — Telephone Encounter (Signed)
Pt wanted to verify imitrex refill had been done; advised pt refill was done and pt will ck with pharmacy.

## 2014-07-02 ENCOUNTER — Other Ambulatory Visit: Payer: Self-pay | Admitting: *Deleted

## 2014-07-02 MED ORDER — TOPIRAMATE 100 MG PO TABS: 100.0000 mg | ORAL_TABLET | Freq: Every day | ORAL | Status: DC

## 2014-07-15 ENCOUNTER — Other Ambulatory Visit: Payer: Self-pay | Admitting: Family Medicine

## 2014-07-16 NOTE — Telephone Encounter (Signed)
Pt requesting medication refill. Last f/u appt 02/2014. pls advise 

## 2014-07-17 NOTE — Telephone Encounter (Signed)
Rx called in to requested pharmacy 

## 2014-07-27 ENCOUNTER — Other Ambulatory Visit: Payer: Self-pay | Admitting: Family Medicine

## 2014-07-28 NOTE — Telephone Encounter (Signed)
Received refill request electronically. Last office visit 02/07/14, last refill flexeril 06/25/14 #30, imitrex 07/01/14 #9. Is it okay to refill medications?

## 2014-08-13 ENCOUNTER — Other Ambulatory Visit: Payer: Self-pay | Admitting: Family Medicine

## 2014-08-14 NOTE — Telephone Encounter (Signed)
Last f/u appt 02/2014 

## 2014-08-14 NOTE — Telephone Encounter (Signed)
Rx called in to requested pharmacy 

## 2014-08-26 ENCOUNTER — Other Ambulatory Visit: Payer: Self-pay | Admitting: Family Medicine

## 2014-08-27 NOTE — Telephone Encounter (Signed)
Pt left v/m requesting status of imitrex refill to [facility]. Pt going out of town for work on 08/28/14. Pt request cb. Pt last seen 02/07/14 and rx last filled 07/28/14.

## 2014-09-17 ENCOUNTER — Other Ambulatory Visit: Payer: Self-pay | Admitting: Family Medicine

## 2014-09-18 NOTE — Telephone Encounter (Signed)
Rx called in to pharmacy. 

## 2014-09-18 NOTE — Telephone Encounter (Signed)
Last f/u appt 02/2014. Pt is needing f/u according to Rx protocol

## 2014-09-24 ENCOUNTER — Other Ambulatory Visit: Payer: Self-pay | Admitting: Family Medicine

## 2014-09-30 ENCOUNTER — Other Ambulatory Visit: Payer: Self-pay | Admitting: Family Medicine

## 2014-10-06 ENCOUNTER — Ambulatory Visit: Payer: PRIVATE HEALTH INSURANCE | Admitting: Family Medicine

## 2014-10-13 ENCOUNTER — Ambulatory Visit: Payer: PRIVATE HEALTH INSURANCE | Admitting: Family Medicine

## 2014-10-21 ENCOUNTER — Other Ambulatory Visit: Payer: Self-pay | Admitting: Family Medicine

## 2014-10-22 ENCOUNTER — Other Ambulatory Visit: Payer: Self-pay | Admitting: Family Medicine

## 2014-10-22 NOTE — Telephone Encounter (Signed)
Rx called in to requested pharmacy 

## 2014-10-22 NOTE — Telephone Encounter (Signed)
Pt cancelled last 5/6 appts. pls advise

## 2014-11-05 ENCOUNTER — Encounter: Payer: Self-pay | Admitting: *Deleted

## 2014-11-05 ENCOUNTER — Ambulatory Visit (INDEPENDENT_AMBULATORY_CARE_PROVIDER_SITE_OTHER): Payer: PRIVATE HEALTH INSURANCE | Admitting: Family Medicine

## 2014-11-05 ENCOUNTER — Encounter: Payer: Self-pay | Admitting: Family Medicine

## 2014-11-05 ENCOUNTER — Ambulatory Visit (INDEPENDENT_AMBULATORY_CARE_PROVIDER_SITE_OTHER)
Admission: RE | Admit: 2014-11-05 | Discharge: 2014-11-05 | Disposition: A | Discharge status: Home / Self Care | Payer: PRIVATE HEALTH INSURANCE | Source: Ambulatory Visit | Attending: Family Medicine | Admitting: Family Medicine

## 2014-11-05 ENCOUNTER — Encounter: Payer: Self-pay | Admitting: Internal Medicine

## 2014-11-05 VITALS — BP 116/82 | HR 88 | Temp 98.1°F | Wt 161.2 lb

## 2014-11-05 DIAGNOSIS — Z1212 Encounter for screening for malignant neoplasm of rectum: Secondary | ICD-10-CM

## 2014-11-05 DIAGNOSIS — I499 Cardiac arrhythmia, unspecified: Secondary | ICD-10-CM | POA: Diagnosis not present

## 2014-11-05 DIAGNOSIS — R609 Edema, unspecified: Secondary | ICD-10-CM | POA: Insufficient documentation

## 2014-11-05 DIAGNOSIS — E7800 Pure hypercholesterolemia, unspecified: Secondary | ICD-10-CM

## 2014-11-05 DIAGNOSIS — M25562 Pain in left knee: Secondary | ICD-10-CM

## 2014-11-05 DIAGNOSIS — IMO0002 Reserved for concepts with insufficient information to code with codable children: Secondary | ICD-10-CM

## 2014-11-05 DIAGNOSIS — Z800 Family history of malignant neoplasm of digestive organs: Secondary | ICD-10-CM

## 2014-11-05 DIAGNOSIS — Z1211 Encounter for screening for malignant neoplasm of colon: Secondary | ICD-10-CM

## 2014-11-05 DIAGNOSIS — E1165 Type 2 diabetes mellitus with hyperglycemia: Secondary | ICD-10-CM

## 2014-11-05 DIAGNOSIS — R002 Palpitations: Secondary | ICD-10-CM

## 2014-11-05 LAB — COMPREHENSIVE METABOLIC PANEL
ALT: 21 U/L (ref 0–35)
AST: 18 U/L (ref 0–37)
Albumin: 4.1 g/dL (ref 3.5–5.2)
Alkaline Phosphatase: 56 U/L (ref 39–117)
BUN: 21 mg/dL (ref 6–23)
CALCIUM: 9 mg/dL (ref 8.4–10.5)
CO2: 27 mEq/L (ref 19–32)
Chloride: 106 mEq/L (ref 96–112)
Creatinine, Ser: 1.1 mg/dL (ref 0.40–1.20)
GFR: 55.69 mL/min — AB (ref >60.00)
GLUCOSE: 157 mg/dL — AB (ref 70–99)
Potassium: 4.3 mEq/L (ref 3.5–5.1)
Sodium: 138 mEq/L (ref 135–145)
TOTAL PROTEIN: 7.4 g/dL (ref 6.0–8.3)
Total Bilirubin: 0.3 mg/dL (ref 0.2–1.2)

## 2014-11-05 LAB — CBC WITH DIFFERENTIAL/PLATELET
BASOS PCT: 0.4 % (ref 0.0–3.0)
Basophils Absolute: 0 10*3/uL (ref 0.0–0.1)
Eosinophils Absolute: 0.2 10*3/uL (ref 0.0–0.7)
Eosinophils Relative: 1.6 % (ref 0.0–5.0)
HCT: 43 % (ref 36.0–46.0)
Hemoglobin: 14.5 g/dL (ref 12.0–15.0)
Lymphocytes Relative: 25.8 % (ref 12.0–46.0)
Lymphs Abs: 2.4 10*3/uL (ref 0.7–4.0)
MCHC: 33.8 g/dL (ref 30.0–36.0)
MCV: 87.5 fl (ref 78.0–100.0)
Monocytes Absolute: 0.6 10*3/uL (ref 0.1–1.0)
Monocytes Relative: 6.6 % (ref 3.0–12.0)
NEUTROS PCT: 65.6 % (ref 43.0–77.0)
Neutro Abs: 6.1 10*3/uL (ref 1.4–7.7)
Platelets: 346 10*3/uL (ref 150.0–400.0)
RBC: 4.91 Mil/uL (ref 3.87–5.11)
RDW: 13.4 % (ref 11.5–15.5)
WBC: 9.2 10*3/uL (ref 4.0–10.5)

## 2014-11-05 LAB — MICROALBUMIN / CREATININE URINE RATIO
Creatinine,U: 137.5 mg/dL
MICROALB UR: 0.7 mg/dL (ref 0.0–1.9)
MICROALB/CREAT RATIO: 0.5 mg/g (ref 0.0–30.0)

## 2014-11-05 LAB — LIPID PANEL
Cholesterol: 126 mg/dL (ref 0–200)
HDL: 34.4 mg/dL — AB (ref >39.00)
LDL Cholesterol: 55 mg/dL (ref 0–99)
NonHDL: 91.6
Total CHOL/HDL Ratio: 4
Triglycerides: 181 mg/dL — ABNORMAL HIGH (ref 0.0–149.0)
VLDL: 36.2 mg/dL (ref 0.0–40.0)

## 2014-11-05 LAB — BRAIN NATRIURETIC PEPTIDE: Pro B Natriuretic peptide (BNP): 12 pg/mL (ref 0.0–100.0)

## 2014-11-05 LAB — HEMOGLOBIN A1C: HEMOGLOBIN A1C: 6.9 % — AB (ref 4.6–6.5)

## 2014-11-05 LAB — TSH: TSH: 4.14 u[IU]/mL (ref 0.35–4.50)

## 2014-11-05 NOTE — Assessment & Plan Note (Signed)
No edema on exam. Check labs today.

## 2014-11-05 NOTE — Assessment & Plan Note (Signed)
Referred for screening colonoscopy 

## 2014-11-05 NOTE — Assessment & Plan Note (Signed)
Continue current rxs. Check labs and urine micro today.

## 2014-11-05 NOTE — Progress Notes (Signed)
Subjective:   Patient ID: Janice JarvisJennifer Y Greene, female    DOB: 11/12/1965, 49 y.o.   MRN: [id number]  Janice JarvisJennifer Y Greene is a pleasant 49 y.o. year old female who presents to clinic today with Follow-up and Hot Flashes  on 11/05/2014  HPI: Here to discuss several issues.  Her brother was diagnosed with colon cancer last week.  He is 51 years old.  Having surgery on 5/13. She has never had a colonoscopy. Denies any changes in her bowel habits or blood in her stool.  Irregular heart beat/palpiations- has been an intermittent issue for years.  Saw Dr. Milinda Antisower for this in 2011- EKG reviewed. She is not sure what the triggers are if there are any.  Feels like her heart runs for several beats hard and quickly for a few seconds.  Never associated with CP or SOB but sometimes makes her feel like she has to cough. Dad died of MI in his 970s.  Does not drink much caffeine.  Lab Results  Component Value Date   CHOL 156 02/07/2014   HDL 33.60* 02/07/2014   LDLCALC 53 09/04/2013   LDLDIRECT 90.9 02/07/2014   TRIG 276.0* 02/07/2014   CHOLHDL 5 02/07/2014   Left knee pain- no known injury.  For four months or so, lateral left knee pain with grinding feeling.  Pain worse when she is walking or standing for long periods of time. She has noticed some swelling in her hands and legs but not in her knees. Has not really tried anything for it.   DM- Due  for lab work.  On Glucovance. Does not check FSBS regularly, denies any episodes of hypoglycemia. She is also taking a statin. Lab Results  Component Value Date   HGBA1C 6.5 02/07/2014    Current Outpatient Prescriptions on File Prior to Visit  Medication Sig Dispense Refill  . ALPRAZolam (XANAX) 0.25 MG tablet TAKE 1 TABLET BY MOUTH THREE TIMES DAILY AS NEEDED 30 tablet 0  . citalopram (CELEXA) 20 MG tablet TAKE ONE-HALF TABLET BY MOUTH DAILY FOR 1 WEEK, THEN OK TO INCREASE TO 1 TABLET DAILY 30 tablet 5  . cyclobenzaprine (FLEXERIL) 10 MG tablet TAKE 1  TABLET BY MOUTH EVERY NIGHT AT BEDTIME 30 tablet 3  . fenofibrate 54 MG tablet TAKE 1 TABLET BY MOUTH TWICE DAILY 60 tablet 5  . gabapentin (NEURONTIN) 600 MG tablet TAKE 1 TABLET 3 TIMES A DAY. 90 tablet 2  . glucose blood (BAYER CONTOUR TEST) test strip Use as instructed 100 each 12  . glyBURIDE-metformin (GLUCOVANCE) 2.5-500 MG per tablet TAKE 1 TABLET BY MOUTH TWICE A DAY WITH MEALS 60 tablet 5  . simvastatin (ZOCOR) 40 MG tablet TAKE 1 TABLET BY MOUTH EVERY DAY 30 tablet 0  . SUMAtriptan (IMITREX) 100 MG tablet TAKE 1 TABLET BY MOUTH EVERY 2 HOURS AS NEEDED FOR HEADACHE AS DIRECTED 9 tablet 0  . topiramate (TOPAMAX) 100 MG tablet Take 1 tablet (100 mg total) by mouth daily. 30 tablet 1   No current facility-administered medications on file prior to visit.    No Known Allergies  Past Medical History  Diagnosis Date  . Headache(784.0)   . Anxiety   . Palpitations   . Hyperlipidemia     No past surgical history on file.  Family History  Problem Relation Age of Onset  . Cancer Brother [id number]    colon  . Heart disease Father     History   Social History  . Marital Status:  Married    Spouse Name: N/A  . Number of Children: 2  . Years of Education: N/A   Occupational History  . operation admin    Social History Main Topics  . Smoking status: Former Games developermoker  . Smokeless tobacco: Not on file     Comment: Previous social smoker  . Alcohol Use: No  . Drug Use: No  . Sexual Activity: Not on file   Other Topics Concern  . Not on file   Social History Narrative   The PMH, PSH, Social History, Family History, Medications, and allergies have been reviewed in [facility], and have been updated if relevant.   Review of Systems  Constitutional: Positive for fatigue.  HENT: Negative.   Respiratory: Negative.   Cardiovascular: Positive for palpitations and leg swelling. Negative for chest pain.  Gastrointestinal: Negative.   Endocrine: Negative.   Musculoskeletal: Negative.   Negative for joint swelling and gait problem.  Skin: Negative.   Neurological: Negative.   Hematological: Negative.   Psychiatric/Behavioral: Negative.   All other systems reviewed and are negative.      Objective:    BP 116/82 mmHg  Pulse 88  Temp(Src) 98.1 F (36.7 C) (Oral)  Wt 161 lb 4 oz (73.143 kg)  SpO2 98%  LMP 10/12/2014 (Within Weeks)   Physical Exam  Constitutional: She is oriented to person, place, and time. She appears well-developed and well-nourished. No distress.  HENT:  Head: Normocephalic and atraumatic.  Eyes: Conjunctivae are normal.  Neck: Normal range of motion.  Cardiovascular: Normal rate, regular rhythm and normal heart sounds.   Pulmonary/Chest: Effort normal and breath sounds normal. No respiratory distress.  Abdominal: Soft.  Musculoskeletal: She exhibits no edema.       Left knee: She exhibits normal range of motion, no swelling and no effusion. Tenderness found. Lateral joint line tenderness noted.  Neurological: She is alert and oriented to person, place, and time.  Skin: Skin is warm and dry. She is not diaphoretic.  Psychiatric: She has a normal mood and affect. Her behavior is normal. Judgment and thought content normal.  Nursing note and vitals reviewed.         Assessment & Plan:   Irregular heart beats  Family history of colon cancer - Plan: Ambulatory referral to Gastroenterology  Screening for colorectal cancer - Plan: Ambulatory referral to Gastroenterology  Left knee pain - Plan: DG Knee Complete 4 Views Left, CBC with Differential/Platelet, TSH, Hemoglobin A1c  Pure hypercholesterolemia - Plan: Comprehensive metabolic panel, Lipid panel  Palpitations - Plan: EKG 12-Lead No Follow-up on file.

## 2014-11-05 NOTE — Patient Instructions (Addendum)
Good to see you. I am so sorry to hear about your brother.  Please stop by to see Janice Greene on your way out.  We will also call you with your lab results.

## 2014-11-05 NOTE — Progress Notes (Signed)
Pre visit review using our clinic review tool, if applicable. No additional management support is needed unless otherwise documented below in the visit note. 

## 2014-11-05 NOTE — Assessment & Plan Note (Signed)
Likely meniscal injury. Start with knee xray today for initial work up. The patient indicates understanding of these issues and agrees with the plan.

## 2014-11-05 NOTE — Assessment & Plan Note (Signed)
Due for labs. Check today. 

## 2014-11-05 NOTE — Assessment & Plan Note (Signed)
EKG and exam reassuring. Refer to cardiology for evaluation/holter monitor. The patient indicates understanding of these issues and agrees with the plan.

## 2014-11-25 ENCOUNTER — Other Ambulatory Visit: Payer: Self-pay | Admitting: Family Medicine

## 2014-11-26 ENCOUNTER — Other Ambulatory Visit: Payer: Self-pay | Admitting: Family Medicine

## 2014-11-26 NOTE — Telephone Encounter (Signed)
Rx called in to requested pharmacy 

## 2014-11-26 NOTE — Telephone Encounter (Signed)
Pt left v/m requesting status of alprazolam and imitrex refill to [facility]. Pt last seen for f/u appt on 11/05/14 and last f/u for anxiety on 02/07/14. No future appt scheduled.both rx last refilled on 10/22/14.

## 2014-12-04 ENCOUNTER — Ambulatory Visit: Payer: PRIVATE HEALTH INSURANCE | Admitting: Cardiovascular Disease

## 2014-12-09 ENCOUNTER — Other Ambulatory Visit: Payer: Self-pay | Admitting: Family Medicine

## 2014-12-16 ENCOUNTER — Other Ambulatory Visit: Payer: Self-pay | Admitting: Family Medicine

## 2014-12-18 ENCOUNTER — Ambulatory Visit (AMBULATORY_SURGERY_CENTER): Payer: Self-pay

## 2014-12-18 ENCOUNTER — Encounter (INDEPENDENT_AMBULATORY_CARE_PROVIDER_SITE_OTHER): Payer: Self-pay

## 2014-12-18 VITALS — Ht 63.0 in | Wt 158.2 lb

## 2014-12-18 DIAGNOSIS — Z800 Family history of malignant neoplasm of digestive organs: Secondary | ICD-10-CM

## 2014-12-18 NOTE — Progress Notes (Signed)
No allergies to eggs or soy No diet/weight loss meds No home oxygen No past problems with anesthesia  Has email  Emmi instructions given for colonoscopy 

## 2014-12-25 ENCOUNTER — Ambulatory Visit: Payer: PRIVATE HEALTH INSURANCE | Admitting: Cardiology

## 2014-12-30 ENCOUNTER — Other Ambulatory Visit: Payer: Self-pay | Admitting: Family Medicine

## 2014-12-31 NOTE — Telephone Encounter (Signed)
Rx called in to requested pharmacy 

## 2014-12-31 NOTE — Telephone Encounter (Signed)
Last f/u appt 02/2014 

## 2015-01-09 ENCOUNTER — Encounter: Payer: PRIVATE HEALTH INSURANCE | Admitting: Internal Medicine

## 2015-01-12 ENCOUNTER — Encounter: Payer: Self-pay | Admitting: Cardiovascular Disease

## 2015-01-12 ENCOUNTER — Ambulatory Visit (INDEPENDENT_AMBULATORY_CARE_PROVIDER_SITE_OTHER): Payer: PRIVATE HEALTH INSURANCE | Admitting: Cardiovascular Disease

## 2015-01-12 ENCOUNTER — Encounter (INDEPENDENT_AMBULATORY_CARE_PROVIDER_SITE_OTHER): Payer: Self-pay

## 2015-01-12 VITALS — BP 104/76 | HR 78 | Ht 63.0 in | Wt 158.8 lb

## 2015-01-12 DIAGNOSIS — E7800 Pure hypercholesterolemia, unspecified: Secondary | ICD-10-CM

## 2015-01-12 DIAGNOSIS — E1165 Type 2 diabetes mellitus with hyperglycemia: Secondary | ICD-10-CM

## 2015-01-12 DIAGNOSIS — R609 Edema, unspecified: Secondary | ICD-10-CM

## 2015-01-12 DIAGNOSIS — IMO0002 Reserved for concepts with insufficient information to code with codable children: Secondary | ICD-10-CM

## 2015-01-12 DIAGNOSIS — I499 Cardiac arrhythmia, unspecified: Secondary | ICD-10-CM

## 2015-01-12 DIAGNOSIS — Z720 Tobacco use: Secondary | ICD-10-CM

## 2015-01-12 DIAGNOSIS — F17200 Nicotine dependence, unspecified, uncomplicated: Secondary | ICD-10-CM

## 2015-01-12 MED ORDER — PROPRANOLOL HCL 20 MG PO TABS: 20.0000 mg | ORAL_TABLET | Freq: Three times a day (TID) | ORAL | Status: DC | PRN

## 2015-01-12 NOTE — Patient Instructions (Signed)
You are doing well.  Please take propranolol as needed (1/2 to a whole pill for palpitations) If symptoms get worse, call the office for a monitor  Please call us if you have new issues that need to be addressed before your next appt.

## 2015-01-12 NOTE — Assessment & Plan Note (Signed)
Cholesterol is at goal on the current lipid regimen. No changes to the medications were made.  

## 2015-01-12 NOTE — Assessment & Plan Note (Signed)
Currently with no symptoms of edema on today's visit

## 2015-01-12 NOTE — Assessment & Plan Note (Signed)
Symptoms are currently stable, not a major issue. Suspect she may be having one of any number of arrhythmias such as atrial tachycardia, APCs. Unable to rule out very short run of SVT. Even PVCs Treatment would typically be the same with beta blockers, possibly even calcium channel blockers. She is currently not having symptoms, we'll hold off on any event monitor/Holter monitor. Recommend she call if symptoms come back and this could be ordered.  We have given her a prescription for propranolol to take as needed

## 2015-01-12 NOTE — Progress Notes (Signed)
Patient ID: Janice Greene, female    DOB: 12/13/1965, 49 y.o.   MRN: [id number]  HPI Comments: Janice Greene is a very pleasant 49 year old woman with strong family history of coronary artery disease, hyperlipidemia, diabetes type 2, who presents for evaluation of palpitations and irregular heartbeat.  She reports that over the past several years she has waxing waning symptoms of palpitations. Symptoms present for several seconds at a time, very sporadic in nature, usually at rest at work or sometimes at nighttime. Sometimes she describes her symptoms as a very heartbeat, other times as rapid with a pause before going back to normal.  Otherwise she reports she is doing well. No significant shortness of breath with exertion. Very active at baseline.  EKG on today's visit shows normal sinus rhythm with rate 78 bpm. Sinus arrhythmia noted   No Known Allergies  Current Outpatient Prescriptions on File Prior to Visit  Medication Sig Dispense Refill  . ALPRAZolam (XANAX) 0.25 MG tablet TAKE 1 TABLET BY MOUTH THREE TIMES DAILY AS NEEDED 30 tablet 0  . citalopram (CELEXA) 20 MG tablet TAKE ONE-HALF TABLET BY MOUTH DAILY FOR 1 WEEK, THEN OK TO INCREASE TO 1 TABLET DAILY 30 tablet 5  . cyclobenzaprine (FLEXERIL) 10 MG tablet TAKE 1 TABLET BY MOUTH EVERY NIGHT AT BEDTIME 30 tablet 1  . fenofibrate 54 MG tablet TAKE 1 TABLET BY MOUTH TWICE DAILY (Patient taking differently: TAKE 2 TABLET BY MOUTH DAILY) 60 tablet 5  . gabapentin (NEURONTIN) 600 MG tablet TAKE 1 TABLET 3 TIMES A DAY. (Patient taking differently: TAKE 1 TABLET DAILY.) 90 tablet 2  . glucose blood (BAYER CONTOUR TEST) test strip Use as instructed 100 each 12  . glyBURIDE-metformin (GLUCOVANCE) 2.5-500 MG per tablet TAKE 1 TABLET BY MOUTH TWICE DAILY WITH MEALS. (Patient taking differently: TAKE 1 TABLET BY MOUTH DAILY WITH MEALS.) 60 tablet 5  . simvastatin (ZOCOR) 40 MG tablet TAKE 1 TABLET BY MOUTH EVERY DAY 30 tablet 5  .  SUMAtriptan (IMITREX) 100 MG tablet TAKE 1 TABLET BY MOUTH EVERY 2 HOURS AS NEEDED FOR HEADACHE AS DIRECTED 9 tablet 0   No current facility-administered medications on file prior to visit.    Past Medical History  Diagnosis Date  . Headache(784.0)   . Anxiety   . Palpitations   . Hyperlipidemia   . History of blood transfusion     after childbirth  . Borderline diabetes     Past Surgical History  Procedure Laterality Date  . Cholecystectomy  1989  . Tubal ligation  1992    Social History  reports that she has been smoking Cigarettes.  She has a 2.5 pack-year smoking history. She has never used smokeless tobacco. She reports that she does not drink alcohol or use illicit drugs.  Family History family history includes Arrhythmia in her brother and sister; Cancer (age of onset: 6567) in her brother; Colon cancer in her brother; Heart attack in her brother and father; Heart disease in her father and mother. Father died in his early 3870s from MI      Review of Systems  Constitutional: Negative.   HENT: Negative.   Eyes: Negative.   Respiratory: Negative.   Cardiovascular: Negative.   Gastrointestinal: Negative.   Endocrine: Negative.   Musculoskeletal: Negative.   Skin: Negative.   Allergic/Immunologic: Negative.   Neurological: Negative.   Hematological: Negative.   Psychiatric/Behavioral: Negative.   All other systems reviewed and are negative.   BP 104/76 mmHg  Pulse 78  Ht  (1.6 m)  Wt 158 lb 12 oz (72.009 kg)  BMI 28.13 kg/m2  LMP 12/13/2014  Physical Exam  Constitutional: She is oriented to person, place, and time. She appears well-developed and well-nourished.  HENT:  Head: Normocephalic.  Nose: Nose normal.  Mouth/Throat: Oropharynx is clear and moist.  Eyes: Conjunctivae are normal. Pupils are equal, round, and reactive to light.  Neck: Normal range of motion. Neck supple. No JVD present.  Cardiovascular: Normal rate, regular rhythm, S1 normal,  S2 normal, normal heart sounds and intact distal pulses.  Exam reveals no gallop and no friction rub.   No murmur heard. Pulmonary/Chest: Effort normal and breath sounds normal. No respiratory distress. She has no wheezes. She has no rales. She exhibits no tenderness.  Abdominal: Soft. Bowel sounds are normal. She exhibits no distension. There is no tenderness.  Musculoskeletal: Normal range of motion. She exhibits no edema or tenderness.  Lymphadenopathy:    She has no cervical adenopathy.  Neurological: She is alert and oriented to person, place, and time. Coordination normal.  Skin: Skin is warm and dry. No rash noted. No erythema.  Psychiatric: She has a normal mood and affect. Her behavior is normal. Judgment and thought content normal.    Assessment and Plan  Nursing note and vitals reviewed.

## 2015-01-12 NOTE — Assessment & Plan Note (Signed)
Very rare smoking, not on a regular basis. Recommended smoking cessation

## 2015-01-12 NOTE — Assessment & Plan Note (Signed)
We have encouraged continued exercise, careful diet management in an effort to lose weight. 

## 2015-01-22 ENCOUNTER — Other Ambulatory Visit: Payer: Self-pay | Admitting: Family Medicine

## 2015-01-23 ENCOUNTER — Encounter: Payer: PRIVATE HEALTH INSURANCE | Admitting: Internal Medicine

## 2015-01-28 ENCOUNTER — Encounter: Payer: PRIVATE HEALTH INSURANCE | Admitting: Internal Medicine

## 2015-01-29 ENCOUNTER — Other Ambulatory Visit: Payer: Self-pay | Admitting: Family Medicine

## 2015-02-01 ENCOUNTER — Other Ambulatory Visit: Payer: Self-pay | Admitting: Family Medicine

## 2015-02-02 NOTE — Telephone Encounter (Signed)
Rx called in to requested pharmacy 

## 2015-02-02 NOTE — Telephone Encounter (Signed)
Last f/u appt 02/2014 

## 2015-02-09 ENCOUNTER — Other Ambulatory Visit: Payer: Self-pay | Admitting: Family Medicine

## 2015-03-09 ENCOUNTER — Other Ambulatory Visit: Payer: Self-pay | Admitting: Family Medicine

## 2015-03-10 NOTE — Telephone Encounter (Signed)
Pt has not had f/u visit since 07/2012. Has cancelled all f/u appts and been seen for acute only.

## 2015-03-10 NOTE — Telephone Encounter (Signed)
Rx called in to requested pharmacy 

## 2015-03-12 ENCOUNTER — Encounter: Payer: Self-pay | Admitting: Internal Medicine

## 2015-03-16 ENCOUNTER — Other Ambulatory Visit: Payer: Self-pay

## 2015-03-16 MED ORDER — CITALOPRAM HYDROBROMIDE 20 MG PO TABS: ORAL_TABLET | ORAL | Status: DC

## 2015-03-16 NOTE — Telephone Encounter (Signed)
Left message notifying patient.

## 2015-03-16 NOTE — Telephone Encounter (Signed)
Pt left v/m requesting refill citalopram to [facility]; pt questions note about needing f/u appt(see 03/09/15 phone note); appears pt was seen 11/05/14 as f/u and labs done. Last refill # 30 done 02/10/15. Last seen 11/05/14. Is it OK to fill? Pt request cb.

## 2015-04-06 ENCOUNTER — Ambulatory Visit: Payer: PRIVATE HEALTH INSURANCE | Admitting: Family Medicine

## 2015-04-15 ENCOUNTER — Other Ambulatory Visit: Payer: Self-pay | Admitting: Family Medicine

## 2015-04-16 ENCOUNTER — Other Ambulatory Visit: Payer: Self-pay | Admitting: Family Medicine

## 2015-04-16 NOTE — Telephone Encounter (Signed)
pts last f/u appt 07/2012

## 2015-04-16 NOTE — Telephone Encounter (Signed)
Rx called in to requested pharmacy 

## 2015-05-06 ENCOUNTER — Ambulatory Visit (AMBULATORY_SURGERY_CENTER): Payer: Self-pay | Admitting: *Deleted

## 2015-05-06 VITALS — Ht 63.5 in | Wt 164.0 lb

## 2015-05-06 DIAGNOSIS — Z800 Family history of malignant neoplasm of digestive organs: Secondary | ICD-10-CM

## 2015-05-06 DIAGNOSIS — Z1211 Encounter for screening for malignant neoplasm of colon: Secondary | ICD-10-CM

## 2015-05-06 NOTE — Progress Notes (Signed)
Patient denies any allergies to eggs or soy. Patient denies any problems with anesthesia/sedation. Patient denies any oxygen use at home and does not take any diet/weight loss medications. EMMI education assisgned to patient on colonoscopy, this was explained and instructions given to patient. 

## 2015-05-12 ENCOUNTER — Other Ambulatory Visit: Payer: Self-pay | Admitting: Family Medicine

## 2015-05-20 ENCOUNTER — Encounter: Payer: Self-pay | Admitting: Internal Medicine

## 2015-05-20 ENCOUNTER — Ambulatory Visit (AMBULATORY_SURGERY_CENTER): Payer: PRIVATE HEALTH INSURANCE | Admitting: Internal Medicine

## 2015-05-20 VITALS — BP 121/75 | HR 70 | Temp 98.0°F | Resp 16 | Ht 63.5 in | Wt 168.0 lb

## 2015-05-20 DIAGNOSIS — Z1211 Encounter for screening for malignant neoplasm of colon: Secondary | ICD-10-CM | POA: Diagnosis present

## 2015-05-20 DIAGNOSIS — Z800 Family history of malignant neoplasm of digestive organs: Secondary | ICD-10-CM

## 2015-05-20 MED ORDER — SODIUM CHLORIDE 0.9 % IV SOLN: 500.0000 mL | INTRAVENOUS | Status: DC

## 2015-05-20 NOTE — Patient Instructions (Addendum)
The colon was normal!  Your next routine colonoscopy should be in 5 years - 2021.  I appreciate the opportunity to care for you. Iva Booparl E. Gessner, MD, FACG   YOU HAD AN ENDOSCOPIC PROCEDURE TODAY AT THE [facility]:   Refer to the procedure report that was given to you for any specific questions about what was found during the examination.  If the procedure report does not answer your questions, please call your gastroenterologist to clarify.  If you requested that your care partner not be given the details of your procedure findings, then the procedure report has been included in a sealed envelope for you to review at your convenience later.  YOU SHOULD EXPECT: Some feelings of bloating in the abdomen. Passage of more gas than usual.  Walking can help get rid of the air that was put into your GI tract during the procedure and reduce the bloating. If you had a lower endoscopy (such as a colonoscopy or flexible sigmoidoscopy) you may notice spotting of blood in your stool or on the toilet paper. If you underwent a bowel prep for your procedure, you may not have a normal bowel movement for a few days.  Please Note:  You might notice some irritation and congestion in your nose or some drainage.  This is from the oxygen used during your procedure.  There is no need for concern and it should clear up in a day or so.  SYMPTOMS TO REPORT IMMEDIATELY:   Following lower endoscopy (colonoscopy or flexible sigmoidoscopy):  Excessive amounts of blood in the stool  Significant tenderness or worsening of abdominal pains  Swelling of the abdomen that is new, acute  Fever of 100F or higher   For urgent or emergent issues, a gastroenterologist can be reached at any hour by calling [phone].   DIET: Your first meal following the procedure should be a small meal and then it is ok to progress to your normal diet. Heavy or fried foods are harder to digest and may make you feel  nauseous or bloated.  Likewise, meals heavy in dairy and vegetables can increase bloating.  Drink plenty of fluids but you should avoid alcoholic beverages for 24 hours.  ACTIVITY:  You should plan to take it easy for the rest of today and you should NOT DRIVE or use heavy machinery until tomorrow (because of the sedation medicines used during the test).    FOLLOW UP: Our staff will call the number listed on your records the next business day following your procedure to check on you and address any questions or concerns that you may have regarding the information given to you following your procedure. If we do not reach you, we will leave a message.  However, if you are feeling well and you are not experiencing any problems, there is no need to return our call.  We will assume that you have returned to your regular daily activities without incident.  If any biopsies were taken you will be contacted by phone or by letter within the next 1-3 weeks.  Please call us at [phone] if you have not heard about the biopsies in 3 weeks.    SIGNATURES/CONFIDENTIALITY: You and/or your care partner have signed paperwork which will be entered into your electronic medical record.  These signatures attest to the fact that that the information above on your After Visit Summary has been reviewed and is understood.  Full responsibility of the confidentiality of this  discharge information lies with you and/or your care-partner. 

## 2015-05-20 NOTE — Op Note (Signed)
[facility] [address] [place], [id number]   COLONOSCOPY PROCEDURE REPORT  PATIENT: Markus JarvisClapp, Danniell Y  MR#: [id number] BIRTHDATE: 1966/04/13 , 49  yrs. old GENDER: female ENDOSCOPIST: Iva Booparl E Gessner, MD, [facility] PROCEDURE DATE:  05/20/2015 PROCEDURE:   Colonoscopy, screening First Screening Colonoscopy - Avg.  risk and is 50 yrs.  old or older Yes.  Prior Negative Screening - Now for repeat screening. N/A  History of Adenoma - Now for follow-up colonoscopy & has been > or = to 3 yrs.  N/A  Polyps removed today? No Recommend repeat exam, <10 yrs? Yes high risk ASA CLASS:   Class II INDICATIONS:Screening for colonic neoplasia and FH Colon or Rectal Adenocarcinoma. MEDICATIONS: Propofol 300 mg IV and Monitored anesthesia care  DESCRIPTION OF PROCEDURE:   After the risks benefits and alternatives of the procedure were thoroughly explained, informed consent was obtained.  The digital rectal exam revealed no abnormalities of the rectum.   The LB [id number]  endoscope was introduced through the anus and advanced to the cecum, which was identified by both the appendix and ileocecal valve. No adverse events experienced.   The quality of the prep was excellent. (MiraLax was used)  The instrument was then slowly withdrawn as the colon was fully examined. Estimated blood loss is zero unless otherwise noted in this procedure report.      COLON FINDINGS: A normal appearing cecum, ileocecal valve, and appendiceal orifice were identified.  The ascending, transverse, descending, sigmoid colon, and rectum appeared unremarkable. Retroflexed views revealed no abnormalities. The time to cecum = 3.8 Withdrawal time = 8.5   The scope was withdrawn and the procedure completed. COMPLICATIONS: There were no immediate complications.  ENDOSCOPIC IMPRESSION: Normal colonoscopy - excellent prep - first screening  RECOMMENDATIONS: Repeat Colonoscopy in 5 years.  2021 (FHx CRCA  brother dx 965)  eSigned:  Iva Booparl E Gessner, MD, [facility] 05/20/2015 9:14 AM   cc: Dr. Ruthe Mannanalia Aron and The Patient

## 2015-05-20 NOTE — Progress Notes (Signed)
Patient awakening,vss,report to rn 

## 2015-05-21 ENCOUNTER — Telehealth: Payer: Self-pay | Admitting: Internal Medicine

## 2015-05-21 NOTE — Telephone Encounter (Signed)
°  Follow up Call-  Call back number 05/20/2015  Post procedure Call Back phone  # [phone]  Permission to leave phone message Yes     Patient questions:  Do you have a fever, pain , or abdominal swelling? No. Pain Score  0 *  Have you tolerated food without any problems? Yes.    Have you been able to return to your normal activities? Yes.    Do you have any questions about your discharge instructions: Diet   No. Medications  No. Follow up visit  No.  Do you have questions or concerns about your Care? No.  Actions: * If pain score is 4 or above: No action needed, pain <4.

## 2015-05-24 ENCOUNTER — Other Ambulatory Visit: Payer: Self-pay | Admitting: Family Medicine

## 2015-05-25 NOTE — Telephone Encounter (Signed)
Last f/u over 1+yr

## 2015-05-25 NOTE — Telephone Encounter (Signed)
Ok to refill one time only.  Needs OV for further refills. 

## 2015-05-26 ENCOUNTER — Other Ambulatory Visit: Payer: Self-pay

## 2015-05-26 DIAGNOSIS — Z1231 Encounter for screening mammogram for malignant neoplasm of breast: Secondary | ICD-10-CM

## 2015-05-26 NOTE — Telephone Encounter (Signed)
Rx called in to requested pharmacy. LM on pts vm and informed her OV required for additional refills

## 2015-06-04 ENCOUNTER — Other Ambulatory Visit: Payer: Self-pay | Admitting: Family Medicine

## 2015-06-12 ENCOUNTER — Other Ambulatory Visit: Payer: Self-pay | Admitting: *Deleted

## 2015-06-12 MED ORDER — FENOFIBRATE 54 MG PO TABS: 54.0000 mg | ORAL_TABLET | Freq: Two times a day (BID) | ORAL | Status: DC

## 2015-06-12 MED ORDER — GABAPENTIN 600 MG PO TABS: 600.0000 mg | ORAL_TABLET | Freq: Three times a day (TID) | ORAL | Status: DC

## 2015-06-16 ENCOUNTER — Other Ambulatory Visit: Payer: Self-pay | Admitting: Family Medicine

## 2015-06-17 ENCOUNTER — Ambulatory Visit
Admission: RE | Admit: 2015-06-17 | Discharge: 2015-06-17 | Disposition: A | Discharge status: Home / Self Care | Payer: PRIVATE HEALTH INSURANCE | Source: Ambulatory Visit

## 2015-06-17 DIAGNOSIS — Z1231 Encounter for screening mammogram for malignant neoplasm of breast: Secondary | ICD-10-CM

## 2015-06-23 ENCOUNTER — Other Ambulatory Visit: Payer: Self-pay | Admitting: Family Medicine

## 2015-07-04 ENCOUNTER — Other Ambulatory Visit: Payer: Self-pay | Admitting: Family Medicine

## 2015-07-06 ENCOUNTER — Other Ambulatory Visit: Payer: Self-pay | Admitting: Family Medicine

## 2015-07-16 ENCOUNTER — Other Ambulatory Visit: Payer: Self-pay | Admitting: Family Medicine

## 2015-07-29 ENCOUNTER — Other Ambulatory Visit: Payer: Self-pay | Admitting: Family Medicine

## 2015-08-28 ENCOUNTER — Encounter: Payer: Self-pay | Admitting: Primary Care

## 2015-08-28 ENCOUNTER — Ambulatory Visit (INDEPENDENT_AMBULATORY_CARE_PROVIDER_SITE_OTHER): Payer: PRIVATE HEALTH INSURANCE | Admitting: Primary Care

## 2015-08-28 VITALS — BP 126/82 | HR 94 | Temp 98.9°F | Ht 63.0 in | Wt 163.4 lb

## 2015-08-28 DIAGNOSIS — J101 Influenza due to other identified influenza virus with other respiratory manifestations: Secondary | ICD-10-CM

## 2015-08-28 DIAGNOSIS — Z2089 Contact with and (suspected) exposure to other communicable diseases: Secondary | ICD-10-CM

## 2015-08-28 DIAGNOSIS — Z20818 Contact with and (suspected) exposure to other bacterial communicable diseases: Secondary | ICD-10-CM

## 2015-08-28 LAB — POCT INFLUENZA A/B
INFLUENZA B, POC: POSITIVE — AB
Influenza A, POC: NEGATIVE

## 2015-08-28 LAB — POCT RAPID STREP A (OFFICE): Rapid Strep A Screen: NEGATIVE

## 2015-08-28 MED ORDER — OSELTAMIVIR PHOSPHATE 75 MG PO CAPS: 75.0000 mg | ORAL_CAPSULE | Freq: Two times a day (BID) | ORAL | Status: DC

## 2015-08-28 NOTE — Patient Instructions (Addendum)
You have influenza type B.  Start Tamiflu. Take 1 tablet by mouth twice daily for 5 days.  Nasal Congestion: Try using Flonase (fluticasone) nasal spray. Instill 2 sprays in each nostril once daily.   Apply hydrocortisone cream twice daily for itchy skin.  Increase consumption of fluids and rest.  It was a pleasure meeting you!  Influenza, Adult Influenza ("the flu") is a viral infection of the respiratory tract. It occurs more often in winter months because people spend more time in close contact with one another. Influenza can make you feel very sick. Influenza easily spreads from person to person (contagious). CAUSES  Influenza is caused by a virus that infects the respiratory tract. You can catch the virus by breathing in droplets from an infected person's cough or sneeze. You can also catch the virus by touching something that was recently contaminated with the virus and then touching your mouth, nose, or eyes. RISKS AND COMPLICATIONS You may be at risk for a more severe case of influenza if you smoke cigarettes, have diabetes, have chronic heart disease (such as heart failure) or lung disease (such as asthma), or if you have a weakened immune system. Elderly people and pregnant women are also at risk for more serious infections. The most common problem of influenza is a lung infection (pneumonia). Sometimes, this problem can require emergency medical care and may be life threatening. SIGNS AND SYMPTOMS  Symptoms typically last 4 to 10 days and may include:  Fever.  Chills.  Headache, body aches, and muscle aches.  Sore throat.  Chest discomfort and cough.  Poor appetite.  Weakness or feeling tired.  Dizziness.  Nausea or vomiting. DIAGNOSIS  Diagnosis of influenza is often made based on your history and a physical exam. A nose or throat swab test can be done to confirm the diagnosis. TREATMENT  In mild cases, influenza goes away on its own. Treatment is directed at  relieving symptoms. For more severe cases, your health care provider may prescribe antiviral medicines to shorten the sickness. Antibiotic medicines are not effective because the infection is caused by a virus, not by bacteria. HOME CARE INSTRUCTIONS  Take medicines only as directed by your health care provider.  Use a cool mist humidifier to make breathing easier.  Get plenty of rest until your temperature returns to normal. This usually takes 3 to 4 days.  Drink enough fluid to keep your urine clear or pale yellow.  Cover yourmouth and nosewhen coughing or sneezing,and wash your handswellto prevent thevirusfrom spreading.  Stay homefromwork orschool untilthe fever is gonefor at least 16full day. PREVENTION  An annual influenza vaccination (flu shot) is the best way to avoid getting influenza. An annual flu shot is now routinely recommended for all adults in the [place] SEEK MEDICAL CARE IF:  You experiencechest pain, yourcough worsens,or you producemore mucus.  Youhave nausea,vomiting, ordiarrhea.  Your fever returns or gets worse. SEEK IMMEDIATE MEDICAL CARE IF:  You havetrouble breathing, you become short of breath,or your skin ornails becomebluish.  You have severe painor stiffnessin the neck.  You develop a sudden headache, or pain in the face or ear.  You have nausea or vomiting that you cannot control. MAKE SURE YOU:   Understand these instructions.  Will watch your condition.  Will get help right away if you are not doing well or get worse.   This information is not intended to replace advice given to you by your health care provider. Make sure you discuss any questions  you have with your health care provider.   Document Released: 06/17/2000 Document Revised: 07/11/2014 Document Reviewed: 09/19/2011 Elsevier Interactive Patient Education [employer].

## 2015-08-28 NOTE — Progress Notes (Signed)
Subjective:    Patient ID: Janice Greene, female    DOB: 07-25-1965, 50 y.o.   MRN: [id number]  HPI  Janice Greene is a 50 year old female who presents today with a chief complaint of cough. She also reports nasal congestion, low grade fever, scratchy throat, and fatigue. Her symptoms have been present since Wednesday.   Her husband was diagnosed with the flu on Tuesday this week and her granddaughter was diagnosed with strep 2 weeks ago and has just completed antibiotic treatment. Denies sinus pressure. Her cough is non productive. Her ribs are sore from coughing. She's taking cough drops, Mucinex, tylenol. Overall her symptoms are about the same.   Review of Systems  Constitutional: Positive for fever, chills and fatigue.  HENT: Positive for congestion and sore throat.   Respiratory: Positive for cough. Negative for shortness of breath and wheezing.   Cardiovascular: Negative for chest pain.  Musculoskeletal: Positive for myalgias.       Past Medical History  Diagnosis Date  . Headache(784.0)   . Anxiety   . Palpitations   . Hyperlipidemia   . History of blood transfusion     after childbirth  . Borderline diabetes   . Diabetes mellitus without complication [facility])     Social History   Social History  . Marital Status: Married    Spouse Name: N/A  . Number of Children: 2  . Years of Education: N/A   Occupational History  . operation admin    Social History Main Topics  . Smoking status: Former Smoker -- 0.25 packs/day for 10 years    Types: Cigarettes  . Smokeless tobacco: Never Used     Comment: Previous social smoker  . Alcohol Use: No  . Drug Use: No  . Sexual Activity: Not on file   Other Topics Concern  . Not on file   Social History Narrative    Past Surgical History  Procedure Laterality Date  . Cholecystectomy  1989  . Tubal ligation  1992    Family History  Problem Relation Age of Onset  . Colon cancer Brother 40  . Heart attack Brother   .  Arrhythmia Brother   . Heart disease Father   . Heart attack Father   . Heart disease Mother   . Arrhythmia Sister     No Known Allergies  Current Outpatient Prescriptions on File Prior to Visit  Medication Sig Dispense Refill  . citalopram (CELEXA) 20 MG tablet TAKE 1 TABLET BY MOUTH DAILY 30 tablet 11  . cyclobenzaprine (FLEXERIL) 10 MG tablet TAKE 1 TABLET BY MOUTH EVERY NIGHT AT BEDTIME 30 tablet 2  . fenofibrate 54 MG tablet Take 1 tablet (54 mg total) by mouth 2 (two) times daily. 120 tablet 1  . gabapentin (NEURONTIN) 600 MG tablet Take 1 tablet (600 mg total) by mouth 3 (three) times daily. 90 tablet 0  . glucose blood (BAYER CONTOUR TEST) test strip Use as instructed 100 each 12  . glyBURIDE-metformin (GLUCOVANCE) 2.5-500 MG per tablet TAKE 1 TABLET BY MOUTH TWICE DAILY WITH MEALS. (Patient taking differently: TAKE 1 TABLET BY MOUTH DAILY WITH MEALS.) 60 tablet 5  . simvastatin (ZOCOR) 40 MG tablet TAKE 1 TABLET BY MOUTH EVERY DAY 30 tablet 5  . SUMAtriptan (IMITREX) 100 MG tablet TAKE 1 TABLET BY MOUTH EVERY 2 HOURS AS NEEDED FOR HEADACHE AS DIRECTED. 9 tablet 2  . ALPRAZolam (XANAX) 0.25 MG tablet TAKE 1 TABLET BY MOUTH THREE TIMES DAILY AS  NEEDED. PATIENT MUST HAVE OFFICE VISIT FOR REFILLS (Patient not taking: Reported on 08/28/2015) 30 tablet 0  . propranolol (INDERAL) 20 MG tablet Take 1 tablet (20 mg total) by mouth 3 (three) times daily as needed. (Patient not taking: Reported on 05/06/2015) 90 tablet 3   No current facility-administered medications on file prior to visit.    BP 126/82 mmHg  Pulse 94  Temp(Src) 98.9 F (37.2 C) (Oral)  Ht  (1.6 m)  Wt 163 lb 6.4 oz (74.118 kg)  BMI 28.95 kg/m2  SpO2 98%    Objective:   Physical Exam  Constitutional: She appears well-nourished. She appears ill.  HENT:  Right Ear: Tympanic membrane and ear canal normal.  Left Ear: Tympanic membrane and ear canal normal.  Nose: Mucosal edema present. Right sinus exhibits no  maxillary sinus tenderness and no frontal sinus tenderness. Left sinus exhibits no maxillary sinus tenderness and no frontal sinus tenderness.  Mouth/Throat: Oropharynx is clear and moist.  Neck: Neck supple.  Cardiovascular: Normal rate and regular rhythm.   Pulmonary/Chest: Effort normal and breath sounds normal.  Lymphadenopathy:    She has cervical adenopathy.  Skin: Skin is warm and dry.          Assessment & Plan:  Influenza:  Cough, chills, low grade fever, fatigue x 2 days. Husband diagnosed with flu Tuesday. Rapid Flu: Positive for B. RX for Tamiflu provided as she is within the window for treatment. Flonase, Robitussin, fluids, rest. Return precautions provided.

## 2015-08-28 NOTE — Progress Notes (Signed)
Pre visit review using our clinic review tool, if applicable. No additional management support is needed unless otherwise documented below in the visit note. 

## 2015-09-07 ENCOUNTER — Other Ambulatory Visit: Payer: Self-pay | Admitting: Family Medicine

## 2015-09-07 NOTE — Telephone Encounter (Signed)
Last f/u 11/2014. pls advise

## 2015-09-09 ENCOUNTER — Other Ambulatory Visit: Payer: Self-pay | Admitting: Family Medicine

## 2015-09-10 NOTE — Telephone Encounter (Signed)
Last labs-11/2014 abnormal. pls advise

## 2015-09-22 ENCOUNTER — Other Ambulatory Visit: Payer: Self-pay | Admitting: Family Medicine

## 2015-10-29 ENCOUNTER — Other Ambulatory Visit: Payer: Self-pay | Admitting: Family Medicine

## 2015-11-02 ENCOUNTER — Other Ambulatory Visit: Payer: Self-pay | Admitting: Family Medicine

## 2015-11-02 NOTE — Telephone Encounter (Signed)
Pt requesting refill last filled 09/07/15, last OV with you was 11/05/14. She saw clark on 08/28/15 for a cough.

## 2015-11-10 ENCOUNTER — Other Ambulatory Visit: Payer: Self-pay | Admitting: Family Medicine

## 2015-12-01 ENCOUNTER — Other Ambulatory Visit: Payer: Self-pay | Admitting: Family Medicine

## 2015-12-07 ENCOUNTER — Ambulatory Visit (INDEPENDENT_AMBULATORY_CARE_PROVIDER_SITE_OTHER): Payer: PRIVATE HEALTH INSURANCE | Admitting: Family Medicine

## 2015-12-07 ENCOUNTER — Encounter: Payer: Self-pay | Admitting: Family Medicine

## 2015-12-07 VITALS — BP 122/68 | HR 90 | Temp 98.1°F | Wt 159.5 lb

## 2015-12-07 DIAGNOSIS — E7800 Pure hypercholesterolemia, unspecified: Secondary | ICD-10-CM

## 2015-12-07 DIAGNOSIS — G2581 Restless legs syndrome: Secondary | ICD-10-CM | POA: Diagnosis not present

## 2015-12-07 DIAGNOSIS — E1100 Type 2 diabetes mellitus with hyperosmolarity without nonketotic hyperglycemic-hyperosmolar coma (NKHHC): Secondary | ICD-10-CM

## 2015-12-07 DIAGNOSIS — L301 Dyshidrosis [pompholyx]: Secondary | ICD-10-CM

## 2015-12-07 LAB — LIPID PANEL
CHOL/HDL RATIO: 5
Cholesterol: 184 mg/dL (ref 0–200)
HDL: 34 mg/dL — AB (ref >39.00)

## 2015-12-07 LAB — COMPREHENSIVE METABOLIC PANEL
ALT: 32 U/L (ref 0–35)
AST: 29 U/L (ref 0–37)
Albumin: 4.4 g/dL (ref 3.5–5.2)
Alkaline Phosphatase: 75 U/L (ref 39–117)
BUN: 20 mg/dL (ref 6–23)
CHLORIDE: 104 meq/L (ref 96–112)
CO2: 28 meq/L (ref 19–32)
Calcium: 9.8 mg/dL (ref 8.4–10.5)
Creatinine, Ser: 1.06 mg/dL (ref 0.40–1.20)
GFR: 57.88 mL/min — AB (ref >60.00)
GLUCOSE: 196 mg/dL — AB (ref 70–99)
POTASSIUM: 4.3 meq/L (ref 3.5–5.1)
Sodium: 140 mEq/L (ref 135–145)
Total Bilirubin: 0.5 mg/dL (ref 0.2–1.2)
Total Protein: 7.6 g/dL (ref 6.0–8.3)

## 2015-12-07 LAB — CBC WITH DIFFERENTIAL/PLATELET
Basophils Absolute: 0 10*3/uL (ref 0.0–0.1)
Basophils Relative: 0.4 % (ref 0.0–3.0)
EOS ABS: 0.2 10*3/uL (ref 0.0–0.7)
Eosinophils Relative: 2.1 % (ref 0.0–5.0)
HCT: 45.8 % (ref 36.0–46.0)
HEMOGLOBIN: 15.3 g/dL — AB (ref 12.0–15.0)
LYMPHS ABS: 2.6 10*3/uL (ref 0.7–4.0)
Lymphocytes Relative: 30.7 % (ref 12.0–46.0)
MCHC: 33.4 g/dL (ref 30.0–36.0)
MCV: 88.6 fl (ref 78.0–100.0)
MONO ABS: 0.6 10*3/uL (ref 0.1–1.0)
Monocytes Relative: 7.5 % (ref 3.0–12.0)
NEUTROS PCT: 59.3 % (ref 43.0–77.0)
Neutro Abs: 5.1 10*3/uL (ref 1.4–7.7)
Platelets: 372 10*3/uL (ref 150.0–400.0)
RBC: 5.17 Mil/uL — AB (ref 3.87–5.11)
RDW: 13.1 % (ref 11.5–15.5)
WBC: 8.6 10*3/uL (ref 4.0–10.5)

## 2015-12-07 LAB — HEMOGLOBIN A1C: HEMOGLOBIN A1C: 8.2 % — AB (ref 4.6–6.5)

## 2015-12-07 LAB — MAGNESIUM: MAGNESIUM: 2.1 mg/dL (ref 1.5–2.5)

## 2015-12-07 LAB — LDL CHOLESTEROL, DIRECT: LDL DIRECT: 72 mg/dL

## 2015-12-07 LAB — TSH: TSH: 5.52 u[IU]/mL — AB (ref 0.35–4.50)

## 2015-12-07 MED ORDER — FLUOCINONIDE-E 0.05 % EX CREA: 1.0000 "application " | TOPICAL_CREAM | Freq: Two times a day (BID) | CUTANEOUS | Status: DC

## 2015-12-07 NOTE — Assessment & Plan Note (Signed)
New- discussed supportive care as per AVS. eRx sent for lidex to use twice daily for no more than 10 days without updating me. The patient indicates understanding of these issues and agrees with the plan.

## 2015-12-07 NOTE — Progress Notes (Signed)
Pre visit review using our clinic review tool, if applicable. No additional management support is needed unless otherwise documented below in the visit note. 

## 2015-12-07 NOTE — Assessment & Plan Note (Signed)
Continue current dose of zocor. Due for labs today. 

## 2015-12-07 NOTE — Assessment & Plan Note (Signed)
Continue current dose of glucovance. A1c, urine micro, and lipid panel today. Advised making an appointment for a dilated eye exam. Declines pneumococcal vaccination. Orders Placed This Encounter  Procedures  . Comprehensive metabolic panel  . Lipid panel  . Hemoglobin A1c  . TSH  . CBC with Differential/Platelet  . Microalbumin / creatinine urine ratio  . Magnesium

## 2015-12-07 NOTE — Patient Instructions (Addendum)
Great to see you. Please schedule your eye exam.  I will call you with your lab results.  Hand Dermatitis Hand dermatitis (dyshidrotic eczema) is a skin condition in which small, itchy, raised dots or fluid-filled blisters form over the palms of the hands. Outbreaks of hand dermatitis can last 3 to 4 weeks. CAUSES  The cause of hand dermatitis is unknown. However, it occurs most often in patients with a history of allergies such as:  Hay fever.  Allergic asthma.  Allergies to latex. Chemical exposure, injuries, and environmental irritants can make hand dermatitis worse. Washing your hands too frequently can remove natural oils, which can dry out the skin and contribute to outbreaks of hand dermatitis. SYMPTOMS  The most common symptom of hand dermatitis is intense itching. Cracks or grooves (fissures) on the fingers can also develop. Affected areas can be painful, especially areas where large blisters have formed. DIAGNOSIS Your caregiver can usually tell what the problem is by doing a physical exam. PREVENTION  Avoid excessive hand washing.  Avoid the use of harsh chemicals.  Wear protective gloves when handling products that can irritate your skin. TREATMENT  Steroid creams and ointments, such as over-the-counter 1% hydrocortisone cream, can reduce inflammation and improve moisture retention. These should be applied at least 2 to 4 times per day. Your caregiver may ask you to use a stronger prescription steroid cream to help speed the healing of blistered and cracked skin. In severe cases, oral steroid medicine may be needed. If you have an infection, antibiotics may be needed. Your caregiver may also prescribe antihistamines. These medicines help reduce itching. HOME CARE INSTRUCTIONS  Only take over-the-counter or prescription medicines as directed by your caregiver.  You may use wet or cold compresses. This can help:  Alleviate itching.  Increase the effectiveness of topical  creams.  Minimize blisters. SEEK MEDICAL CARE IF:  The rash is not better after 1 week of treatment.  Signs of infection develop, such as redness, tenderness, or yellowish-white fluid (pus).  The rash is spreading.   This information is not intended to replace advice given to you by your health care provider. Make sure you discuss any questions you have with your health care provider.   Document Released: 06/20/2005 Document Revised: 09/12/2011 Document Reviewed: 01/02/2015 Elsevier Interactive Patient Education [employer]2016 Elsevier Inc.

## 2015-12-07 NOTE — Assessment & Plan Note (Signed)
Deteriorated. Continue current dose of neurontin for now. Check labs today to rule out other possible contributing factors. The patient indicates understanding of these issues and agrees with the plan.

## 2015-12-07 NOTE — Progress Notes (Signed)
Subjective:   Patient ID: Janice Greene, female    DOB: [birth year], 50 y.o.   MRN: [id number]  Janice Greene is a pleasant 50 y.o. year old female who presents to clinic today with Follow-up and rls  on 12/07/2015  HPI:   DM- Due  for lab work.  On Glucovance. Does not check FSBS regularly, denies any episodes of hypoglycemia. She is also taking a statin. Has not had an eye exam in years.  Lab Results  Component Value Date   CHOL 126 11/05/2014   HDL 34.40* 11/05/2014   LDLCALC 55 11/05/2014   LDLDIRECT 90.9 02/07/2014   TRIG 181.0* 11/05/2014   CHOLHDL 4 11/05/2014   Lab Results  Component Value Date   HGBA1C 6.9* 11/05/2014   RLS has been worse at night lately.  She does take Gabapentin 600 mg three times daily (originally started for headaches).  Rash on her hands- comes and goes for months.  Very dry.  Moisturizing lotions help some.  Sometimes itchy.  Never painful. Current Outpatient Prescriptions on File Prior to Visit  Medication Sig Dispense Refill  . citalopram (CELEXA) 20 MG tablet TAKE 1 TABLET BY MOUTH DAILY 30 tablet 11  . cyclobenzaprine (FLEXERIL) 10 MG tablet TAKE 1 TABLET BY MOUTH EVERY NIGHT AT BEDTIME. NEEDS OFFICE VISIT. 30 tablet 0  . fenofibrate 54 MG tablet Take 1 tablet (54 mg total) by mouth 2 (two) times daily. 120 tablet 1  . gabapentin (NEURONTIN) 600 MG tablet TAKE 1 TABLET 3 TIMES A DAY 90 tablet 0  . glucose blood (BAYER CONTOUR TEST) test strip Use as instructed 100 each 12  . glyBURIDE-metformin (GLUCOVANCE) 2.5-500 MG tablet Take 1 tablet by mouth 2 (two) times daily with a meal. OFFICE VISIT WITH LABS REQUIRED FOR ADDITIONAL REFILLS 60 tablet 0  . propranolol (INDERAL) 20 MG tablet Take 1 tablet (20 mg total) by mouth 3 (three) times daily as needed. 90 tablet 3  . simvastatin (ZOCOR) 40 MG tablet TAKE 1 TABLET BY MOUTH EVERY DAY 30 tablet 0  . SUMAtriptan (IMITREX) 100 MG tablet TAKE 1 TABLET BY MOUTH EVERY 2 HOURS AS NEEDED FOR  HEADACHE AS DIRECTED. 9 tablet 0   No current facility-administered medications on file prior to visit.    No Known Allergies  Past Medical History  Diagnosis Date  . Headache(784.0)   . Anxiety   . Palpitations   . Hyperlipidemia   . History of blood transfusion     after childbirth  . Borderline diabetes   . Diabetes mellitus without complication [facility])     Past Surgical History  Procedure Laterality Date  . Cholecystectomy  1989  . Tubal ligation  1992    Family History  Problem Relation Age of Onset  . Colon cancer Brother 40  . Heart attack Brother   . Arrhythmia Brother   . Heart disease Father   . Heart attack Father   . Heart disease Mother   . Arrhythmia Sister     Social History   Social History  . Marital Status: Married    Spouse Name: N/A  . Number of Children: 2  . Years of Education: N/A   Occupational History  . operation admin    Social History Main Topics  . Smoking status: Former Smoker -- 0.25 packs/day for 10 years    Types: Cigarettes  . Smokeless tobacco: Never Used     Comment: Previous social smoker  . Alcohol Use: No  .  Drug Use: No  . Sexual Activity: Not on file   Other Topics Concern  . Not on file   Social History Narrative   The PMH, PSH, Social History, Family History, Medications, and allergies [facility], and have been updated if relevant.  Review of Systems  Constitutional: Negative.   HENT: Negative.   Eyes: Negative.   Respiratory: Negative.   Cardiovascular: Negative.   Endocrine: Negative.   Musculoskeletal: Negative.        +RLS  Skin: Positive for rash.  Neurological: Negative.   Psychiatric/Behavioral: Negative.        Objective:    BP 122/68 mmHg  Pulse 90  Temp(Src) 98.1 F (36.7 C) (Oral)  Wt 159 lb 8 oz (72.349 kg)  SpO2 98%  LMP 10/22/2015   Physical Exam  Constitutional: She is oriented to person, place, and time. She appears well-developed and well-nourished. No  distress.  HENT:  Head: Normocephalic.  Eyes: Conjunctivae are normal.  Cardiovascular: Normal rate.   Pulmonary/Chest: Effort normal.  Musculoskeletal: Normal range of motion.  Neurological: She is alert and oriented to person, place, and time. No cranial nerve deficit.  Skin: She is not diaphoretic.  Dried patches of skin on inner aspects of 2nd and third digits bilaterally  Psychiatric: She has a normal mood and affect. Her behavior is normal. Judgment and thought content normal.  Nursing note and vitals reviewed.         Assessment & Plan:   Pure hypercholesterolemia - Plan: Comprehensive metabolic panel, Lipid panel  Uncontrolled type 2 diabetes mellitus with hyperosmolarity without coma, without long-term current use of insulin (HCC) - Plan: Hemoglobin A1c  RLS (restless legs syndrome) - Plan: TSH, CBC with Differential/Platelet No Follow-up on file.

## 2015-12-08 ENCOUNTER — Telehealth: Payer: Self-pay | Admitting: Family Medicine

## 2015-12-08 NOTE — Addendum Note (Signed)
Addended by: Baldomero LamyHAVERS, Jael Waldorf C on: 12/08/2015 02:45 PM   Modules accepted: Orders

## 2015-12-08 NOTE — Telephone Encounter (Signed)
Patient asked for Marcelline MatesWaynetta to call her back on her cell phone when lab results are ready.

## 2015-12-09 ENCOUNTER — Ambulatory Visit: Payer: PRIVATE HEALTH INSURANCE | Admitting: Family Medicine

## 2015-12-15 ENCOUNTER — Other Ambulatory Visit: Payer: Self-pay | Admitting: Family Medicine

## 2015-12-28 ENCOUNTER — Other Ambulatory Visit: Payer: Self-pay | Admitting: Family Medicine

## 2016-01-01 ENCOUNTER — Other Ambulatory Visit: Payer: Self-pay | Admitting: Family Medicine

## 2016-01-05 ENCOUNTER — Other Ambulatory Visit: Payer: Self-pay | Admitting: Family Medicine

## 2016-04-08 ENCOUNTER — Other Ambulatory Visit: Payer: Self-pay | Admitting: Family Medicine

## 2016-04-08 NOTE — Telephone Encounter (Signed)
Last filled on 01/01/16 #90, last OV 12/07/15. Ok to refill?

## 2016-05-16 ENCOUNTER — Other Ambulatory Visit: Payer: Self-pay | Admitting: Family Medicine

## 2016-05-16 NOTE — Telephone Encounter (Signed)
Pt has not had f/u for migraines since 10/2012

## 2016-06-13 ENCOUNTER — Other Ambulatory Visit: Payer: Self-pay | Admitting: Family Medicine

## 2016-06-30 ENCOUNTER — Other Ambulatory Visit: Payer: Self-pay

## 2016-06-30 MED ORDER — GABAPENTIN 600 MG PO TABS: 600.0000 mg | ORAL_TABLET | Freq: Three times a day (TID) | ORAL | 3 refills | Status: DC

## 2016-06-30 NOTE — Telephone Encounter (Signed)
Last filled 04/2016--Received mail order 90 day supply request please advise--if okay for 90 day supply will need to change quantity.Marland Kitchen..Marland Kitchen

## 2016-07-14 ENCOUNTER — Other Ambulatory Visit: Payer: Self-pay | Admitting: Family Medicine

## 2016-08-08 ENCOUNTER — Encounter: Payer: Self-pay | Admitting: Family Medicine

## 2016-08-08 ENCOUNTER — Ambulatory Visit (INDEPENDENT_AMBULATORY_CARE_PROVIDER_SITE_OTHER): Payer: PRIVATE HEALTH INSURANCE | Admitting: Family Medicine

## 2016-08-08 VITALS — BP 116/62 | HR 86 | Temp 97.8°F | Wt 162.0 lb

## 2016-08-08 DIAGNOSIS — L301 Dyshidrosis [pompholyx]: Secondary | ICD-10-CM

## 2016-08-08 DIAGNOSIS — E7800 Pure hypercholesterolemia, unspecified: Secondary | ICD-10-CM | POA: Diagnosis not present

## 2016-08-08 DIAGNOSIS — E1100 Type 2 diabetes mellitus with hyperosmolarity without nonketotic hyperglycemic-hyperosmolar coma (NKHHC): Secondary | ICD-10-CM

## 2016-08-08 LAB — LDL CHOLESTEROL, DIRECT: Direct LDL: 51 mg/dL

## 2016-08-08 LAB — COMPREHENSIVE METABOLIC PANEL
ALBUMIN: 4.4 g/dL (ref 3.5–5.2)
ALT: 37 U/L — ABNORMAL HIGH (ref 0–35)
AST: 28 U/L (ref 0–37)
Alkaline Phosphatase: 83 U/L (ref 39–117)
BILIRUBIN TOTAL: 0.5 mg/dL (ref 0.2–1.2)
BUN: 16 mg/dL (ref 6–23)
CALCIUM: 9.5 mg/dL (ref 8.4–10.5)
CHLORIDE: 104 meq/L (ref 96–112)
CO2: 25 meq/L (ref 19–32)
CREATININE: 0.94 mg/dL (ref 0.40–1.20)
GFR: 66.31 mL/min (ref >60.00)
Glucose, Bld: 241 mg/dL — ABNORMAL HIGH (ref 70–99)
Potassium: 4.5 mEq/L (ref 3.5–5.1)
Sodium: 139 mEq/L (ref 135–145)
Total Protein: 7.5 g/dL (ref 6.0–8.3)

## 2016-08-08 LAB — LIPID PANEL
CHOLESTEROL: 130 mg/dL (ref 0–200)
HDL: 39.1 mg/dL (ref >39.00)
NONHDL: 91.07
TRIGLYCERIDES: 250 mg/dL — AB (ref 0.0–149.0)
Total CHOL/HDL Ratio: 3
VLDL: 50 mg/dL — ABNORMAL HIGH (ref 0.0–40.0)

## 2016-08-08 LAB — HEMOGLOBIN A1C: Hgb A1c MFr Bld: 8.7 % — ABNORMAL HIGH (ref 4.6–6.5)

## 2016-08-08 MED ORDER — CLOBETASOL PROPIONATE 0.05 % EX CREA: 1.0000 "application " | TOPICAL_CREAM | Freq: Two times a day (BID) | CUTANEOUS | 0 refills | Status: DC

## 2016-08-08 NOTE — Progress Notes (Signed)
Pre visit review using our clinic review tool, if applicable. No additional management support is needed unless otherwise documented below in the visit note. 

## 2016-08-08 NOTE — Progress Notes (Signed)
Subjective:   Patient ID: Janice JarvisJennifer Y Guerrini, female    DOB: 03/08/1966, 51 y.o.   MRN: [id number]  Janice JarvisJennifer Y Vosler is a pleasant 51 y.o. year old female who presents to clinic today with Rash (hands peeling)  on 08/08/2016  HPI:  Rash on hands is no better.  I saw her months ago for this, felt it was consistent with dishydrotic eczema and placed her on twice daily lidex.  Per pt, it never really got better and now in the winter months, it is getting worse.  Current Outpatient Prescriptions on File Prior to Visit  Medication Sig Dispense Refill  . citalopram (CELEXA) 20 MG tablet TAKE 1 TABLET BY MOUTH DAILY 30 tablet 11  . cyclobenzaprine (FLEXERIL) 10 MG tablet TAKE 1 TABLET(10 MG) BY MOUTH AT BEDTIME 30 tablet 0  . fenofibrate 54 MG tablet TAKE 1 TABLET TWICE A DAY 120 tablet 1  . gabapentin (NEURONTIN) 600 MG tablet Take 1 tablet (600 mg total) by mouth 3 (three) times daily. 90 tablet 3  . glucose blood (BAYER CONTOUR TEST) test strip Use as instructed 100 each 12  . glyBURIDE-metformin (GLUCOVANCE) 2.5-500 MG tablet TAKE 1 TABLET BY MOUTH TWICE DAILY WITH A MEAL. 60 tablet 5  . propranolol (INDERAL) 20 MG tablet Take 1 tablet (20 mg total) by mouth 3 (three) times daily as needed. 90 tablet 3  . simvastatin (ZOCOR) 40 MG tablet Take 1 tablet (40 mg total) by mouth daily. 90 tablet 1  . SUMAtriptan (IMITREX) 100 MG tablet TAKE 1 TABLET BY MOUTH EVERY 2 HOURS AS NEEDED FOR HEADACHE AS DIRECTED. 9 tablet 3   No current facility-administered medications on file prior to visit.     No Known Allergies  Past Medical History:  Diagnosis Date  . Anxiety   . Borderline diabetes   . Diabetes mellitus without complication (HCC)   . Headache(784.0)   . History of blood transfusion    after childbirth  . Hyperlipidemia   . Palpitations     Past Surgical History:  Procedure Laterality Date  . CHOLECYSTECTOMY  1989  . TUBAL LIGATION  1992    Family History  Problem Relation Age of  Onset  . Colon cancer Brother 1065  . Heart attack Brother   . Arrhythmia Brother   . Heart disease Father   . Heart attack Father   . Heart disease Mother   . Arrhythmia Sister     Social History   Social History  . Marital status: Married    Spouse name: N/A  . Number of children: 2  . 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 Smoking status: Former Smoker    Packs/day: 0.25    Years: 10.00    Types: Cigarettes  . Smokeless tobacco: Never Used     Comment: Previous social smoker  . Alcohol use No  . Drug use: No  . Sexual activity: Not on file   Other Topics Concern  . Not on file   Social History Narrative  . No narrative on file   The PMH, PSH, Social History, Family History, Medications, and allergies [facility], and have been updated if relevant.   Review of Systems  Constitutional: Negative.   Skin: Positive for rash.  All other systems reviewed and are negative.      Objective:    BP 116/62   Pulse  86   Temp 97.8 F (36.6 C) (Oral)   Wt 162 lb (73.5 kg)   SpO2 96%   BMI 28.70 kg/m    Physical Exam  Constitutional: She is oriented to person, place, and time. She appears well-developed and well-nourished. No distress.  HENT:  Head: Normocephalic.  Eyes: Conjunctivae are normal.  Cardiovascular: Normal rate.   Pulmonary/Chest: Effort normal.  Musculoskeletal: Normal range of motion.  Neurological: She is alert and oriented to person, place, and time. No cranial nerve deficit.  Skin: She is not diaphoretic.  Hands are dry, peeling, cracked bilaterally  Nursing note and vitals reviewed.         Assessment & Plan:   Dyshidrotic eczema - Plan: Ambulatory referral to Dermatology  Uncontrolled type 2 diabetes mellitus with hyperosmolarity without coma, without long-term current use of insulin (HCC) - Plan: Hemoglobin A1c, Comprehensive metabolic  panel, Lipid panel  Pure hypercholesterolemia No Follow-up on file.

## 2016-08-08 NOTE — Assessment & Plan Note (Signed)
Persistent, failed lidex. Discussed lotions to use, consider wearing glove over hands after applying moisturizer at night. Refer to derm for further work up/tx. eRx sent for clobetasol. The patient indicates understanding of these issues and agrees with the plan.

## 2016-08-08 NOTE — Patient Instructions (Addendum)
Hand Dermatitis Introduction Hand dermatitis is a skin condition that causes small, itchy, raised dots or fluid-filled blisters to form over the palms of the hands. This condition may also be called hand eczema. What are the causes? The cause of this condition is not known. What increases the risk? This condition is more likely to develop in people who have a history of allergies, such as:  Hay fever.  Allergic asthma.  An allergy to latex. Chemical exposure, injuries, and environmental irritants can make hand dermatitis worse. Washing your hands too often can remove natural oils, which can dry out the skin and contribute to outbreaks of this condition. What are the signs or symptoms? The most common symptom of this condition is intense itchiness. Cracks or grooves (fissures) on the fingers can also develop. Affected areas can be painful, especially areas where large blisters have formed. How is this diagnosed? This condition is diagnosed with a medical history and physical exam. How is this treated? This condition is treated with medicines, including:  Steroid creams and ointments.  Oral steroid medicines.  Antibiotic medicines. These are prescribed if you have an infection.  Antihistamine medicines. These help to reduce itchiness. Follow these instructions at home:  Take or apply over-the-counter and prescription medicines only as told by your health care provider.  If you were prescribed an antibiotic medicine, use it as told by your health care provider. Do not stop using the antibiotic even if you start to feel better.  Avoid washing your hands more often than necessary.  Avoid using harsh chemicals on your hands.  Wear protective gloves when you handle products that can irritate your skin.  Keep all follow-up visits as told by your health care provider. This is important. Contact a health care provider if:  Your rash does not improve during the first week of  treatment.  Your rash is red or tender.  Your rash has pus coming from it.  Your rash spreads. This information is not intended to replace advice given to you by your health care provider. Make sure you discuss any questions you have with your health care provider. Document Released: 06/20/2005 Document Revised: 11/26/2015 Document Reviewed: 01/02/2015  2017 Elsevier   Great to see you. Trying using lotion with gloves over your hands at night.  Please stop by to see Shirlee LimerickMarion on your way out.

## 2016-08-10 ENCOUNTER — Other Ambulatory Visit: Payer: Self-pay | Admitting: Family Medicine

## 2016-08-10 MED ORDER — GLYBURIDE-METFORMIN 5-500 MG PO TABS: 1.0000 | ORAL_TABLET | Freq: Two times a day (BID) | ORAL | 3 refills | Status: DC

## 2016-08-16 ENCOUNTER — Other Ambulatory Visit: Payer: Self-pay | Admitting: Family Medicine

## 2016-09-08 ENCOUNTER — Other Ambulatory Visit: Payer: Self-pay

## 2016-09-08 MED ORDER — FENOFIBRATE 54 MG PO TABS: 54.0000 mg | ORAL_TABLET | Freq: Two times a day (BID) | ORAL | 3 refills | Status: DC

## 2016-09-15 ENCOUNTER — Other Ambulatory Visit: Payer: Self-pay | Admitting: Family Medicine

## 2016-09-25 ENCOUNTER — Other Ambulatory Visit: Payer: Self-pay | Admitting: Family Medicine

## 2016-09-26 NOTE — Telephone Encounter (Signed)
Refilled medication

## 2016-10-25 ENCOUNTER — Other Ambulatory Visit: Payer: Self-pay | Admitting: Family Medicine

## 2017-01-05 ENCOUNTER — Other Ambulatory Visit: Payer: Self-pay

## 2017-01-05 MED ORDER — GABAPENTIN 600 MG PO TABS
600.0000 mg | ORAL_TABLET | Freq: Three times a day (TID) | ORAL | 3 refills | Status: DC
Start: 2017-01-05 — End: 2018-02-07

## 2017-01-05 NOTE — Telephone Encounter (Signed)
Last refill 06/30/16 Last OV 08/08/16  Ok to refill?

## 2017-01-12 ENCOUNTER — Other Ambulatory Visit: Payer: Self-pay | Admitting: Family Medicine

## 2017-02-16 ENCOUNTER — Other Ambulatory Visit: Payer: Self-pay | Admitting: Family Medicine

## 2017-02-20 ENCOUNTER — Other Ambulatory Visit: Payer: Self-pay | Admitting: Family Medicine

## 2017-02-22 NOTE — Telephone Encounter (Signed)
Pt left v/m requesting refill of citalopram (last refilled # 90 x 1 on 08/17/16) and cyclobenzaprine (last refilled # 90 x 1 on 08/17/16). [facility] [address]. Last f/u to discuss anxiety or depression I could find was 02/07/2014 and last f/u 12/07/15. No future appt scheduled.Please advise.

## 2017-02-22 NOTE — Telephone Encounter (Signed)
Medication already refilled patient needs appt for further refills.

## 2017-02-22 NOTE — Telephone Encounter (Signed)
Left message on cell vm per dpr to schedule an ov.

## 2017-02-22 NOTE — Telephone Encounter (Signed)
Okay to refill one time only.  Needs OV for further refills. 

## 2017-03-20 ENCOUNTER — Other Ambulatory Visit: Payer: Self-pay | Admitting: Family Medicine

## 2017-03-21 ENCOUNTER — Ambulatory Visit (INDEPENDENT_AMBULATORY_CARE_PROVIDER_SITE_OTHER): Payer: PRIVATE HEALTH INSURANCE | Admitting: Family Medicine

## 2017-03-21 ENCOUNTER — Encounter: Payer: Self-pay | Admitting: Family Medicine

## 2017-03-21 ENCOUNTER — Other Ambulatory Visit: Payer: PRIVATE HEALTH INSURANCE

## 2017-03-21 ENCOUNTER — Other Ambulatory Visit (HOSPITAL_COMMUNITY)
Admission: RE | Admit: 2017-03-21 | Discharge: 2017-03-21 | Disposition: A | Discharge status: Home / Self Care | Payer: PRIVATE HEALTH INSURANCE | Source: Ambulatory Visit | Attending: Family Medicine | Admitting: Family Medicine

## 2017-03-21 VITALS — BP 122/74 | HR 96 | Ht 63.5 in | Wt 162.0 lb

## 2017-03-21 DIAGNOSIS — Z01419 Encounter for gynecological examination (general) (routine) without abnormal findings: Secondary | ICD-10-CM

## 2017-03-21 DIAGNOSIS — E7800 Pure hypercholesterolemia, unspecified: Secondary | ICD-10-CM | POA: Diagnosis not present

## 2017-03-21 DIAGNOSIS — E1169 Type 2 diabetes mellitus with other specified complication: Principal | ICD-10-CM

## 2017-03-21 DIAGNOSIS — F411 Generalized anxiety disorder: Secondary | ICD-10-CM | POA: Diagnosis not present

## 2017-03-21 DIAGNOSIS — IMO0002 Reserved for concepts with insufficient information to code with codable children: Secondary | ICD-10-CM

## 2017-03-21 DIAGNOSIS — E1100 Type 2 diabetes mellitus with hyperosmolarity without nonketotic hyperglycemic-hyperosmolar coma (NKHHC): Secondary | ICD-10-CM | POA: Diagnosis not present

## 2017-03-21 DIAGNOSIS — E1165 Type 2 diabetes mellitus with hyperglycemia: Secondary | ICD-10-CM

## 2017-03-21 LAB — CBC WITH DIFFERENTIAL/PLATELET
BASOS ABS: 0 10*3/uL (ref 0.0–0.1)
Basophils Relative: 0.5 % (ref 0.0–3.0)
Eosinophils Absolute: 0.2 10*3/uL (ref 0.0–0.7)
Eosinophils Relative: 2 % (ref 0.0–5.0)
HCT: 42.8 % (ref 36.0–46.0)
HEMOGLOBIN: 15.3 g/dL — AB (ref 12.0–15.0)
LYMPHS ABS: 2.7 10*3/uL (ref 0.7–4.0)
Lymphocytes Relative: 30.6 % (ref 12.0–46.0)
MCHC: 35.8 g/dL (ref 30.0–36.0)
MCV: 90.7 fl (ref 78.0–100.0)
MONO ABS: 0.6 10*3/uL (ref 0.1–1.0)
MONOS PCT: 7.2 % (ref 3.0–12.0)
NEUTROS ABS: 5.4 10*3/uL (ref 1.4–7.7)
NEUTROS PCT: 59.7 % (ref 43.0–77.0)
Platelets: 354 10*3/uL (ref 150.0–400.0)
RBC: 4.72 Mil/uL (ref 3.87–5.11)
RDW: 13.3 % (ref 11.5–15.5)
WBC: 9 10*3/uL (ref 4.0–10.5)

## 2017-03-21 LAB — COMPREHENSIVE METABOLIC PANEL
AG Ratio: 1.6 (calc) (ref 1.0–2.5)
ALBUMIN MSPROF: 4.2 g/dL (ref 3.6–5.1)
ALT: 39 U/L — AB (ref 6–29)
AST: 33 U/L (ref 10–35)
Alkaline phosphatase (APISO): 104 U/L (ref 33–130)
BILIRUBIN TOTAL: 0.3 mg/dL (ref 0.2–1.2)
BUN/Creatinine Ratio: 18 (calc) (ref 6–22)
BUN: 19 mg/dL (ref 7–25)
CALCIUM: 9 mg/dL (ref 8.6–10.4)
CO2: 15 mmol/L — ABNORMAL LOW (ref 20–32)
CREATININE: 1.07 mg/dL — AB (ref 0.50–1.05)
Chloride: 101 mmol/L (ref 98–110)
GLUCOSE: 246 mg/dL — AB (ref 65–99)
Globulin: 2.6 g/dL (calc) (ref 1.9–3.7)
POTASSIUM: 4.4 mmol/L (ref 3.5–5.3)
SODIUM: 134 mmol/L — AB (ref 135–146)
TOTAL PROTEIN: 6.8 g/dL (ref 6.1–8.1)

## 2017-03-21 LAB — MICROALBUMIN / CREATININE URINE RATIO
Creatinine,U: 128.7 mg/dL
MICROALB UR: 0.8 mg/dL (ref 0.0–1.9)
Microalb Creat Ratio: 0.6 mg/g (ref 0.0–30.0)

## 2017-03-21 LAB — LIPID PANEL
CHOL/HDL RATIO: 10.2 (calc) — AB (ref <5.0)
CHOLESTEROL: 256 mg/dL — AB (ref <200)
HDL: 25 mg/dL — ABNORMAL LOW (ref >50)
Non-HDL Cholesterol (Calc): 231 mg/dL (calc) — ABNORMAL HIGH (ref <130)
TRIGLYCERIDES: 2162 mg/dL — AB (ref <150)

## 2017-03-21 LAB — HEMOGLOBIN A1C: Hgb A1c MFr Bld: 9.4 % — ABNORMAL HIGH (ref 4.6–6.5)

## 2017-03-21 LAB — TSH: TSH: 9.11 u[IU]/mL — AB (ref 0.35–4.50)

## 2017-03-21 MED ORDER — CITALOPRAM HYDROBROMIDE 20 MG PO TABS: 20.0000 mg | ORAL_TABLET | Freq: Every day | ORAL | 3 refills | Status: DC

## 2017-03-21 MED ORDER — CYCLOBENZAPRINE HCL 10 MG PO TABS: ORAL_TABLET | ORAL | 0 refills | Status: DC

## 2017-03-21 NOTE — Assessment & Plan Note (Signed)
Due for a1c, urine micro, lipid panel. Declines pneumococcal vaccine. Continue current rxs.

## 2017-03-21 NOTE — Progress Notes (Signed)
Subjective:   Patient ID: Janice Greene, female    DOB: Sep 11, 1965, 51 y.o.   MRN: [id number]  Janice Greene is a pleasant 51 y.o. year old female who presents to clinic today with Annual Exam (Due for Pap.)  on 03/21/2017  HPI:  Last pap smear 07/31/2012. No PMB or abnormal pap smears.  Making mammogram appointment.  Colonoscopy 05/20/15- brother had colon CA, 5 year recall.  DM- Due  for lab work.  On Glucovance. Does not check FSBS regularly, denies any episodes of hypoglycemia. She is also taking a statin. Has not had an eye exam in years.  Lab Results  Component Value Date   HGBA1C 8.7 (H) 08/08/2016   Lab Results  Component Value Date   CHOL 130 08/08/2016   HDL 39.10 08/08/2016   LDLCALC 55 11/05/2014   LDLDIRECT 51.0 08/08/2016   TRIG 250.0 (H) 08/08/2016   CHOLHDL 3 08/08/2016   Lab Results  Component Value Date   ALT 37 (H) 08/08/2016   AST 28 08/08/2016   ALKPHOS 83 08/08/2016   BILITOT 0.5 08/08/2016   Lab Results  Component Value Date   NA 139 08/08/2016   K 4.5 08/08/2016   CL 104 08/08/2016   CO2 25 08/08/2016    Current Outpatient Prescriptions on File Prior to Visit  Medication Sig Dispense Refill  . fenofibrate 54 MG tablet Take 1 tablet (54 mg total) by mouth 2 (two) times daily. 180 tablet 3  . gabapentin (NEURONTIN) 600 MG tablet Take 1 tablet (600 mg total) by mouth 3 (three) times daily. 90 tablet 3  . glucose blood (BAYER CONTOUR TEST) test strip Use as instructed 100 each 12  . glyBURIDE-metformin (GLUCOVANCE) 5-500 MG tablet TAKE 1 TABLET BY MOUTH TWICE DAILY WITH A MEAL 60 tablet 0  . simvastatin (ZOCOR) 40 MG tablet TAKE 1 TABLET(40 MG) BY MOUTH DAILY 90 tablet 3  . SUMAtriptan (IMITREX) 100 MG tablet TAKE 1 TABLET BY MOUTH EVERY 2 HOURS AS NEEDED FOR HEADACHE AS DIRECTED. 9 tablet 5  . propranolol (INDERAL) 20 MG tablet Take 1 tablet (20 mg total) by mouth 3 (three) times daily as needed. (Patient not taking: Reported on  03/21/2017) 90 tablet 3   No current facility-administered medications on file prior to visit.     No Known Allergies  Past Medical History:  Diagnosis Date  . Anxiety   . Borderline diabetes   . Diabetes mellitus without complication (HCC)   . Headache(784.0)   . History of blood transfusion    after childbirth  . Hyperlipidemia   . Palpitations     Past Surgical History:  Procedure Laterality Date  . CHOLECYSTECTOMY  1989  . TUBAL LIGATION  1992    Family History  Problem Relation Age of Onset  . Colon cancer Brother 62  . Heart attack Brother   . Arrhythmia Brother   . Heart disease Father   . Heart attack Father   . Heart disease Mother   . Arrhythmia Sister     Social History   Social History  . Marital status: Married    Spouse name: N/A  . Number of children: 2  . 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 Smoking status: Former Smoker    Packs/day: 0.25    Years: 10.00    Types: Cigarettes  . Smokeless tobacco: Never Used  Comment: Previous social smoker  . Alcohol use No  . Drug use: No  . Sexual activity: Not on file   Other Topics Concern  . Not on file   Social History Narrative  . No narrative on file   The PMH, PSH, Social History, Family History, Medications, and allergies have been reviewed in [facility], and have been updated if relevant.   Review of Systems  Constitutional: Negative.   HENT: Negative.   Eyes: Negative.   Respiratory: Negative.   Cardiovascular: Negative.   Gastrointestinal: Negative.   Endocrine: Negative.   Genitourinary: Negative.   Musculoskeletal: Negative.   Allergic/Immunologic: Negative.   Neurological: Negative.   Hematological: Negative.   Psychiatric/Behavioral: Negative.   All other systems reviewed and are negative.      Objective:    BP 122/74 (BP Location: Left Arm, Patient Position: Sitting, Cuff Size:  Normal)   Pulse 96   Ht 5' 3.5" (1.613 m)   Wt 162 lb (73.5 kg)   LMP 10/16/2016 (Within Weeks)   SpO2 96%   BMI 28.25 kg/m    Physical Exam   General:  Well-developed,well-nourished,in no acute distress; alert,appropriate and cooperative throughout examination Head:  normocephalic and atraumatic.   Eyes:  vision grossly intact, PERRL Ears:  R ear normal and L ear normal externally, TMs clear bilaterally Nose:  no external deformity.   Mouth:  good dentition.   Neck:  No deformities, masses, or tenderness noted. Breasts:  No mass, nodules, thickening, tenderness, bulging, retraction, inflamation, nipple discharge or skin changes noted.   Lungs:  Normal respiratory effort, chest expands symmetrically. Lungs are clear to auscultation, no crackles or wheezes. Heart:  Normal rate and regular rhythm. S1 and S2 normal without gallop, murmur, click, rub or other extra sounds. Abdomen:  Bowel sounds positive,abdomen soft and non-tender without masses, organomegaly or hernias noted. Rectal:  no external abnormalities.   Genitalia:  Pelvic Exam:        External: normal female genitalia without lesions or masses        Vagina: normal without lesions or masses        Cervix: normal without lesions or masses        Adnexa: normal bimanual exam without masses or fullness        Uterus: normal by palpation        Pap smear: performed Msk:  No deformity or scoliosis noted of thoracic or lumbar spine.   Extremities:  No clubbing, cyanosis, edema, or deformity noted with normal full range of motion of all joints.   Neurologic:  alert & oriented X3 and gait normal.   Skin:  Intact without suspicious lesions or rashes Cervical Nodes:  No lymphadenopathy noted Axillary Nodes:  No palpable lymphadenopathy Psych:  Cognition and judgment appear intact. Alert and cooperative with normal attention span and concentration. No apparent delusions, illusions, hallucinations        Assessment & Plan:    Well woman exam with routine gynecological exam - Plan: CBC with Differential/Platelet, TSH, Hepatitis C Antibody  Pure hypercholesterolemia - Plan: Comprehensive metabolic panel, Lipid panel  Uncontrolled type 2 diabetes mellitus with hyperosmolarity without coma, without long-term current use of insulin (HCC) - Plan: Microalbumin / creatinine urine ratio, Hemoglobin A1c  Anxiety state No Follow-up on file.

## 2017-03-21 NOTE — Assessment & Plan Note (Signed)
Reviewed preventive care protocols, scheduled due services, and updated immunizations Discussed nutrition, exercise, diet, and healthy lifestyle.  Pap smear today.  Declines influenza and pneumococcal vaccines.

## 2017-03-21 NOTE — Addendum Note (Signed)
Addended by: Eual Fines on: 03/21/2017 07:59 AM   Modules accepted: Orders

## 2017-03-21 NOTE — Assessment & Plan Note (Signed)
Symptoms controlled on current dose of celexa.

## 2017-03-22 ENCOUNTER — Other Ambulatory Visit: Payer: Self-pay | Admitting: Family Medicine

## 2017-03-22 DIAGNOSIS — R7989 Other specified abnormal findings of blood chemistry: Secondary | ICD-10-CM

## 2017-03-22 DIAGNOSIS — E785 Hyperlipidemia, unspecified: Secondary | ICD-10-CM

## 2017-03-22 LAB — HEPATITIS C ANTIBODY
Hepatitis C Ab: NONREACTIVE
SIGNAL TO CUT-OFF: 0.05 (ref <1.00)

## 2017-03-22 LAB — CYTOLOGY - PAP
ADEQUACY: ABSENT
DIAGNOSIS: NEGATIVE
HPV (WINDOPATH): NOT DETECTED

## 2017-03-23 ENCOUNTER — Telehealth: Payer: Self-pay | Admitting: Family Medicine

## 2017-03-23 NOTE — Telephone Encounter (Signed)
Spoke with patient regarding labs.  Follow up appointment made to discuss.

## 2017-03-23 NOTE — Telephone Encounter (Signed)
PT saw lab results posted and would like a cb to discuss.

## 2017-03-27 ENCOUNTER — Ambulatory Visit: Payer: PRIVATE HEALTH INSURANCE | Admitting: Family Medicine

## 2017-04-03 ENCOUNTER — Other Ambulatory Visit (INDEPENDENT_AMBULATORY_CARE_PROVIDER_SITE_OTHER): Payer: PRIVATE HEALTH INSURANCE

## 2017-04-03 ENCOUNTER — Ambulatory Visit: Payer: PRIVATE HEALTH INSURANCE | Admitting: Family Medicine

## 2017-04-03 DIAGNOSIS — R7989 Other specified abnormal findings of blood chemistry: Secondary | ICD-10-CM

## 2017-04-03 DIAGNOSIS — E785 Hyperlipidemia, unspecified: Secondary | ICD-10-CM

## 2017-04-03 LAB — LIPID PANEL
CHOLESTEROL: 125 mg/dL (ref 0–200)
HDL: 33.4 mg/dL — ABNORMAL LOW (ref >39.00)
NonHDL: 91.38
Total CHOL/HDL Ratio: 4
Triglycerides: 240 mg/dL — ABNORMAL HIGH (ref 0.0–149.0)
VLDL: 48 mg/dL — ABNORMAL HIGH (ref 0.0–40.0)

## 2017-04-03 LAB — T4, FREE: FREE T4: 0.79 ng/dL (ref 0.60–1.60)

## 2017-04-03 LAB — TSH: TSH: 4.74 u[IU]/mL — AB (ref 0.35–4.50)

## 2017-04-03 LAB — LDL CHOLESTEROL, DIRECT: Direct LDL: 56 mg/dL

## 2017-04-07 ENCOUNTER — Other Ambulatory Visit: Payer: Self-pay | Admitting: Family Medicine

## 2017-04-10 NOTE — Telephone Encounter (Signed)
Pt left v/m requesting refill sumatriptan to [facility] [address]. Last refilled # 9 x 5 on 10/26/16 and pt last seen 03/21/17.

## 2017-04-19 ENCOUNTER — Other Ambulatory Visit: Payer: Self-pay | Admitting: Family Medicine

## 2017-05-07 ENCOUNTER — Other Ambulatory Visit: Payer: Self-pay | Admitting: Family Medicine

## 2017-05-08 ENCOUNTER — Encounter: Payer: Self-pay | Admitting: Family Medicine

## 2017-05-08 ENCOUNTER — Ambulatory Visit: Payer: PRIVATE HEALTH INSURANCE | Admitting: Family Medicine

## 2017-05-08 VITALS — BP 120/80 | Temp 97.7°F | Ht 63.5 in | Wt 161.0 lb

## 2017-05-08 DIAGNOSIS — Z23 Encounter for immunization: Secondary | ICD-10-CM

## 2017-05-08 DIAGNOSIS — E1165 Type 2 diabetes mellitus with hyperglycemia: Secondary | ICD-10-CM | POA: Diagnosis not present

## 2017-05-08 DIAGNOSIS — R202 Paresthesia of skin: Secondary | ICD-10-CM | POA: Diagnosis not present

## 2017-05-08 DIAGNOSIS — R200 Anesthesia of skin: Secondary | ICD-10-CM

## 2017-05-08 MED ORDER — BLOOD GLUCOSE MONITOR KIT: PACK | 0 refills | Status: AC

## 2017-05-08 NOTE — Patient Instructions (Signed)
Can try over the counter splint for use while typing   Carpal Tunnel Syndrome Carpal tunnel syndrome is a condition that causes pain in your hand and arm. The carpal tunnel is a narrow area located on the palm side of your wrist. Repeated wrist motion or certain diseases may cause swelling within the tunnel. This swelling pinches the main nerve in the wrist (median nerve). What are the causes? This condition may be caused by:  Repeated wrist motions.  Wrist injuries.  Arthritis.  A cyst or tumor in the carpal tunnel.  Fluid buildup during pregnancy.  Sometimes the cause of this condition is not known. What increases the risk? This condition is more likely to develop in:  People who have jobs that cause them to repeatedly move their wrists in the same motion, such as Health visitorbutchers and cashiers.  Women.  People with certain conditions, such as: ? Diabetes. ? Obesity. ? An underactive thyroid (hypothyroidism). ? Kidney failure.  What are the signs or symptoms? Symptoms of this condition include:  A tingling feeling in your fingers, especially in your thumb, index, and middle fingers.  Tingling or numbness in your hand.  An aching feeling in your entire arm, especially when your wrist and elbow are bent for long periods of time.  Wrist pain that goes up your arm to your shoulder.  Pain that goes down into your palm or fingers.  A weak feeling in your hands. You may have trouble grabbing and holding items.  Your symptoms may feel worse during the night. How is this diagnosed? This condition is diagnosed with a medical history and physical exam. You may also have tests, including:  An electromyogram (EMG). This test measures electrical signals sent by your nerves into the muscles.  X-rays.  How is this treated? Treatment for this condition includes:  Lifestyle changes. It is important to stop doing or modify the activity that caused your condition.  Physical or  occupational therapy.  Medicines for pain and inflammation. This may include medicine that is injected into your wrist.  A wrist splint.  Surgery.  Follow these instructions at home: If you have a splint:  Wear it as told by your health care provider. Remove it only as told by your health care provider.  Loosen the splint if your fingers become numb and tingle, or if they turn cold and blue.  Keep the splint clean and dry. General instructions  Take over-the-counter and prescription medicines only as told by your health care provider.  Rest your wrist from any activity that may be causing your pain. If your condition is work related, talk to your employer about changes that can be made, such as getting a wrist pad to use while typing.  If directed, apply ice to the painful area: ? Put ice in a plastic bag. ? Place a towel between your skin and the bag. ? Leave the ice on for 20 minutes, 2-3 times per day.  Keep all follow-up visits as told by your health care provider. This is important.  Do any exercises as told by your health care provider, physical therapist, or occupational therapist. Contact a health care provider if:  You have new symptoms.  Your pain is not controlled with medicines.  Your symptoms get worse. This information is not intended to replace advice given to you by your health care provider. Make sure you discuss any questions you have with your health care provider. Document Released: 06/17/2000 Document Revised: 10/29/2015 Document Reviewed:  11/05/2014 Elsevier Interactive Patient Education  2017 [employer].

## 2017-05-08 NOTE — Progress Notes (Signed)
Subjective:    Patient ID: Janice Greene, female    DOB: 16-Aug-1965, 51 y.o.   MRN: [id number]  HPI This is a 51 yo female who presents today to establish care, transferring from Dr. Deborra Medina.  Right hand numbness and tingling- She also reports right hand numbness and tingling x 1-2 months. She has typed her entire life. She has tingling if she holds her hand up. Right upper arm feels like there is a weight after she holds it up. Also has neck pain. She has seen chiropractor but no relief of hand symptoms. Some temporary relief with massage. Has not taken any medication. No pain at night, no pain with pregnancies.   DM-  Last hgbA1C 03/22/17- 9.4, she does not check her blood sugars at home. Her glucometer is old, not sure if it is still functional.   Past Medical History:  Diagnosis Date  . Anxiety   . Borderline diabetes   . Diabetes mellitus without complication ([place])   . Headache(784.0)   . History of blood transfusion    after childbirth  . Hyperlipidemia   . Palpitations    Past Surgical History:  Procedure Laterality Date  . CHOLECYSTECTOMY  1989  . TUBAL LIGATION  1992   Family History  Problem Relation Age of Onset  . Colon cancer Brother 69  . Heart attack Brother   . Arrhythmia Brother   . Heart disease Father   . Heart attack Father   . Heart disease Mother   . Arrhythmia Sister    Social History   Tobacco Use  . Smoking status: Former Smoker    Packs/day: 0.25    Years: 10.00    Pack years: 2.50    Types: Cigarettes  . Smokeless tobacco: Never Used  . Tobacco comment: Previous social smoker  Substance Use Topics  . Alcohol use: No    Alcohol/week: 0.0 oz  . Drug use: No      Review of Systems Per HPI    Objective:   Physical Exam  Constitutional: She appears well-developed and well-nourished. No distress.  HENT:  Head: Normocephalic and atraumatic.  Eyes: Conjunctivae are normal.  Cardiovascular: Normal rate.  Pulmonary/Chest: Effort normal.   Musculoskeletal: Normal range of motion.       Right wrist: She exhibits normal range of motion, no tenderness, no bony tenderness and no swelling.       Right hand: She exhibits normal range of motion, no tenderness and no bony tenderness.  Skin: She is not diaphoretic.  Vitals reviewed.    BP 120/80 (BP Location: Left Arm, Patient Position: Sitting, Cuff Size: Normal)   Temp 97.7 F (36.5 C) (Oral)   Ht 5' 3.5" (1.613 m)   Wt 161 lb (73 kg)   SpO2 97%   BMI 28.07 kg/m   Wt Readings from Last 3 Encounters:  05/08/17 161 lb (73 kg)  03/21/17 162 lb (73.5 kg)  08/08/16 162 lb (73.5 kg)       Assessment & Plan:  1. Numbness and tingling in right hand - discussed improved ergonomics with hand postioning with keyboard use, frequent breaks, can try OTC brace for support/comfort - offered her referral to hand specialist, she declined at this time  2. Uncontrolled type 2 diabetes mellitus with hyperglycemia ([place]) - encouraged her to check blood sugar readings several times a week at different times - blood glucose meter kit and supplies KIT; Dispense based on patient and insurance preference. Use up to four  times daily as directed. (FOR ICD-9 250.00, 250.01).  Dispense: 1 each; Refill: 0 - follow up in 2 months for repeat HbgA1C  3. Need for influenza vaccination - Flu Vaccine QUAD 6+ mos PF IM (Fluarix Quad PF)  4. Need for Tdap vaccination - Tdap vaccine greater than or equal to 53yo IM   Clarene Reamer, FNP-BC  [facility] at [facility], [place]  05/08/2017 1:34 PM

## 2017-06-07 ENCOUNTER — Other Ambulatory Visit: Payer: Self-pay | Admitting: Family Medicine

## 2017-06-21 ENCOUNTER — Other Ambulatory Visit: Payer: Self-pay | Admitting: Family Medicine

## 2017-08-14 ENCOUNTER — Other Ambulatory Visit: Payer: Self-pay | Admitting: Family Medicine

## 2017-08-14 DIAGNOSIS — Z1231 Encounter for screening mammogram for malignant neoplasm of breast: Secondary | ICD-10-CM

## 2017-09-01 ENCOUNTER — Ambulatory Visit
Admission: RE | Admit: 2017-09-01 | Discharge: 2017-09-01 | Disposition: A | Discharge status: Home / Self Care | Payer: PRIVATE HEALTH INSURANCE | Source: Ambulatory Visit | Attending: Family Medicine | Admitting: Family Medicine

## 2017-09-01 DIAGNOSIS — Z1231 Encounter for screening mammogram for malignant neoplasm of breast: Secondary | ICD-10-CM

## 2017-09-27 ENCOUNTER — Other Ambulatory Visit: Payer: Self-pay | Admitting: Family Medicine

## 2017-10-11 ENCOUNTER — Other Ambulatory Visit: Payer: Self-pay | Admitting: Family Medicine

## 2017-10-13 ENCOUNTER — Telehealth: Payer: Self-pay | Admitting: Family Medicine

## 2017-10-13 NOTE — Telephone Encounter (Signed)
Patient overdue for diabetes follow up, please call her and schedule ASAP .

## 2017-10-13 NOTE — Telephone Encounter (Signed)
Spoke to pt and scheduled f/u for 4/15

## 2017-10-16 ENCOUNTER — Encounter: Payer: Self-pay | Admitting: Family Medicine

## 2017-10-16 ENCOUNTER — Ambulatory Visit: Payer: PRIVATE HEALTH INSURANCE | Admitting: Family Medicine

## 2017-10-16 VITALS — BP 118/76 | HR 91 | Temp 98.2°F | Wt 158.0 lb

## 2017-10-16 DIAGNOSIS — E038 Other specified hypothyroidism: Secondary | ICD-10-CM

## 2017-10-16 DIAGNOSIS — E039 Hypothyroidism, unspecified: Secondary | ICD-10-CM

## 2017-10-16 DIAGNOSIS — E1165 Type 2 diabetes mellitus with hyperglycemia: Secondary | ICD-10-CM | POA: Diagnosis not present

## 2017-10-16 LAB — CBC WITH DIFFERENTIAL/PLATELET
BASOS ABS: 0 10*3/uL (ref 0.0–0.1)
BASOS PCT: 0.4 % (ref 0.0–3.0)
EOS ABS: 0.1 10*3/uL (ref 0.0–0.7)
Eosinophils Relative: 1.5 % (ref 0.0–5.0)
HEMATOCRIT: 44.8 % (ref 36.0–46.0)
HEMOGLOBIN: 15.2 g/dL — AB (ref 12.0–15.0)
LYMPHS PCT: 28.7 % (ref 12.0–46.0)
Lymphs Abs: 2.3 10*3/uL (ref 0.7–4.0)
MCHC: 33.9 g/dL (ref 30.0–36.0)
MCV: 89.9 fl (ref 78.0–100.0)
Monocytes Absolute: 0.5 10*3/uL (ref 0.1–1.0)
Monocytes Relative: 6.5 % (ref 3.0–12.0)
Neutro Abs: 5.1 10*3/uL (ref 1.4–7.7)
Neutrophils Relative %: 62.9 % (ref 43.0–77.0)
Platelets: 334 10*3/uL (ref 150.0–400.0)
RBC: 4.99 Mil/uL (ref 3.87–5.11)
RDW: 13 % (ref 11.5–15.5)
WBC: 8.2 10*3/uL (ref 4.0–10.5)

## 2017-10-16 LAB — COMPREHENSIVE METABOLIC PANEL
ALBUMIN: 4.3 g/dL (ref 3.5–5.2)
ALT: 27 U/L (ref 0–35)
AST: 21 U/L (ref 0–37)
Alkaline Phosphatase: 91 U/L (ref 39–117)
BUN: 16 mg/dL (ref 6–23)
CALCIUM: 9.5 mg/dL (ref 8.4–10.5)
CHLORIDE: 101 meq/L (ref 96–112)
CO2: 26 mEq/L (ref 19–32)
CREATININE: 0.88 mg/dL (ref 0.40–1.20)
GFR: 71.23 mL/min (ref >60.00)
Glucose, Bld: 300 mg/dL — ABNORMAL HIGH (ref 70–99)
Potassium: 4.6 mEq/L (ref 3.5–5.1)
Sodium: 137 mEq/L (ref 135–145)
Total Bilirubin: 0.5 mg/dL (ref 0.2–1.2)
Total Protein: 7.4 g/dL (ref 6.0–8.3)

## 2017-10-16 LAB — TSH: TSH: 5.49 u[IU]/mL — ABNORMAL HIGH (ref 0.35–4.50)

## 2017-10-16 LAB — LIPID PANEL
Cholesterol: 147 mg/dL (ref 0–200)
HDL: 35.4 mg/dL — AB (ref >39.00)
NONHDL: 112.06
TRIGLYCERIDES: 306 mg/dL — AB (ref 0.0–149.0)
Total CHOL/HDL Ratio: 4
VLDL: 61.2 mg/dL — AB (ref 0.0–40.0)

## 2017-10-16 LAB — T4, FREE: Free T4: 0.7 ng/dL (ref 0.60–1.60)

## 2017-10-16 LAB — LDL CHOLESTEROL, DIRECT: Direct LDL: 68 mg/dL

## 2017-10-16 LAB — POCT GLYCOSYLATED HEMOGLOBIN (HGB A1C): Hemoglobin A1C: 10.3

## 2017-10-16 NOTE — Patient Instructions (Signed)
Good to see you today  Please follow up in 3 months  Try to walk most days for 20-30 minutes, can divide into 2 sessions  I will notify you of labs and will discuss additional treatment   Let me know about your strips

## 2017-10-16 NOTE — Progress Notes (Signed)
Subjective:    Patient ID: Janice JarvisJennifer Y Pigue, female    DOB: 07/10/1965, 52 y.o.   MRN: [id number]  HPI This is a 52 yo female who presents today for follow up of DM. Was last seen 05/08/17. Was instructed to follow up in 2 months, did not make follow up appointment.   Has not been checking home blood sugars. Prescription for new meter sent at last visit. Patient reports she accidentally threw the new meter away but found her old one and just needs strips.   Dietary recall- breakfast- peanut butter cracker, nuts; Lunch- salad/wrap; Dinner- cooks most nights, grilled chicken, vegetable, rice/potatoes. Snacks- rarely snacks, may have some cheese. Drinks - Kool aid made with splenda, lite cranberry  juice. Diet [facility].   Has been more hungry lately. Thinks related to menopause.   Eye exam- none recent    Past Medical History:  Diagnosis Date  . Anxiety   . Borderline diabetes   . Diabetes mellitus without complication (HCC)   . Headache(784.0)   . History of blood transfusion    after childbirth  . Hyperlipidemia   . Palpitations    Past Surgical History:  Procedure Laterality Date  . AUGMENTATION MAMMAPLASTY Bilateral   . CHOLECYSTECTOMY  1989  . TUBAL LIGATION  1992   Family History  Problem Relation Age of Onset  . Colon cancer Brother [id number]  . Heart attack Brother   . Arrhythmia Brother   . Heart disease Father   . Heart attack Father   . Heart disease Mother   . Arrhythmia Sister    Social History   Tobacco Use  . Smoking status: Former Smoker    Packs/day: 0.25    Years: 10.00    Pack years: 2.50    Types: Cigarettes  . Smokeless tobacco: Never Used  . Tobacco comment: Previous social smoker  Substance Use Topics  . Alcohol use: No    Alcohol/week: 0.0 oz  . Drug use: No      Review of Systems  Constitutional: Positive for fatigue.  Respiratory: Negative for cough and shortness of breath.   Cardiovascular: Negative for chest pain, palpitations and  leg swelling.  Endocrine: Positive for polydipsia, polyphagia and polyuria.  Genitourinary: Positive for frequency.       Objective:   Physical Exam  Constitutional: She is oriented to person, place, and time. She appears well-developed and well-nourished. No distress.  HENT:  Head: Atraumatic.  Eyes: Conjunctivae are normal.  Cardiovascular: Normal rate, regular rhythm and normal heart sounds.  Pulmonary/Chest: Effort normal and breath sounds normal.  Musculoskeletal: She exhibits no edema.  Neurological: She is alert and oriented to person, place, and time.  Skin: Skin is warm and dry. She is not diaphoretic.  Psychiatric: She has a normal mood and affect. Her behavior is normal. Judgment and thought content normal.  Vitals reviewed.     BP 118/76 (BP Location: Left Arm, Patient Position: Sitting, Cuff Size: Normal)   Pulse 91   Temp 98.2 F (36.8 C) (Oral)   Wt 158 lb (71.7 kg)   SpO2 97%   BMI 27.55 kg/m  Wt Readings from Last 3 Encounters:  10/16/17 158 lb (71.7 kg)  05/08/17 161 lb (73 kg)  03/21/17 162 lb (73.5 kg)   Diabetic foot exam: Normal inspection No skin breakdown No calluses  Normal DP/PT pulses Normal sensation to light touch and monofilament Nails normal     Assessment & Plan:  1. Uncontrolled type 2  diabetes mellitus with hyperglycemia (HCC) - HgB A1c - Lipid Panel - TSH - T4, Free - Comprehensive metabolic panel - CBC with Differential - Discussed increased HbBA1c and need to start insulin, will coordinate with RN for teaching - discussed potential long term consequences of persistently elevated blood sugars including kidney disease/dialysis, heart attack, stroke, loss of vision, neuropathy - encouraged her to make healthier food choices and walk 15-20 minutes daily - follow up in 1 month.   Olean Ree, FNP-BC  [facility] at [facility], [facility]  10/17/2017 5:06 PM

## 2017-10-17 ENCOUNTER — Encounter: Payer: Self-pay | Admitting: Family Medicine

## 2017-10-17 MED ORDER — INSULIN DETEMIR 100 UNIT/ML FLEXPEN: 10.0000 [IU] | PEN_INJECTOR | Freq: Every day | SUBCUTANEOUS | 2 refills | Status: DC

## 2017-10-17 MED ORDER — LEVOTHYROXINE SODIUM 50 MCG PO TABS: 50.0000 ug | ORAL_TABLET | Freq: Every day | ORAL | 3 refills | Status: DC

## 2017-10-17 MED ORDER — INSULIN PEN NEEDLE 31G X 5 MM MISC: 1.0000 [IU] | Freq: Every day | 3 refills | Status: DC

## 2017-10-17 NOTE — Addendum Note (Signed)
Addended by: Olean ReeGESSNER, Randle Shatzer B on: 10/17/2017 09:36 PM   Modules accepted: Orders

## 2017-10-18 ENCOUNTER — Encounter: Payer: Self-pay | Admitting: Family Medicine

## 2017-10-18 ENCOUNTER — Telehealth: Payer: Self-pay | Admitting: Family Medicine

## 2017-10-18 NOTE — Telephone Encounter (Signed)
Janice Greene, I will work on this with the patient and keep you updated.

## 2017-10-18 NOTE — Telephone Encounter (Signed)
Called and spoke with patient informing her that her insulin has been approved after calling pharmacy. And explained the medications that have been sent to pharmacy. Understanding verbalized nothing further needed at this time.

## 2017-10-18 NOTE — Telephone Encounter (Signed)
Copied from CRM [id number]. Topic: Quick Communication - See Telephone Encounter >> Oct 18, 2017  2:29 PM Arlyss Gandyichardson, Loui Massenburg N, NT wrote: CRM for notification. See Telephone encounter for: 10/18/17. Pt calling and asking for [place] to give her a call to clarify on what prescriptions have been called in for her.

## 2017-10-24 ENCOUNTER — Other Ambulatory Visit: Payer: Self-pay

## 2017-10-24 MED ORDER — GLUCOSE BLOOD VI STRP: 1.0000 | ORAL_STRIP | 0 refills | Status: DC | PRN

## 2017-10-24 NOTE — Telephone Encounter (Signed)
Patient needs testing strips for Contour next EZ meter sent in.  Will send in 1 year script.

## 2017-10-26 ENCOUNTER — Other Ambulatory Visit: Payer: Self-pay

## 2017-10-26 ENCOUNTER — Ambulatory Visit (INDEPENDENT_AMBULATORY_CARE_PROVIDER_SITE_OTHER): Payer: PRIVATE HEALTH INSURANCE

## 2017-10-26 DIAGNOSIS — E1165 Type 2 diabetes mellitus with hyperglycemia: Secondary | ICD-10-CM

## 2017-10-26 MED ORDER — BAYER MICROLET LANCETS MISC: 2 refills | Status: AC

## 2017-10-26 NOTE — Progress Notes (Signed)
Patient in today for diabetic/insulin teaching around new start on Levemir insulin.   We reviewed general information regarding diabetes management including s/s of hypo and hyperglycemia. New meter given and all coordinating supplies sent in to the pharmacy.  Educational materials given and reviewed regarding diabetes/insulin management.  Patient provided opportunity to discuss her fears and concerns and ask any questions that she may have.  We reviewed her current A1C level and target ranges and goal.  Patient watched a video on the administration of Levemir and we practiced together prior to patient demonstrating independent proper self administration of 10 units here in the office.  Patient verbalizes understanding, safety and comfort with insulin pen use.  She will take 10 units daily at bedtime and check and record her sugars qam fasting and 2hr pp qpm while we are working to regulate her dosing.   Patient will send a my chart message in 1-2 weeks updating provider on how her sugars are running in case any dosing adjustments should be made. She states she was told by provider to set up a recheck appointment with her in 3months.   Patient is aware that she should check sugar at any time she is not feeling well and to hold insulin if sugars are less than 80 before injection and call the office the next am to notify.    Education session lasted 1 hour with patient.  She verbalizes understanding and support and has my number if any further questions moving forward.

## 2017-10-26 NOTE — Telephone Encounter (Addendum)
Patient needs r/x sent in for lancets for blood sugar monitoring.  R/x sent in for 100, 2 refills.

## 2017-11-09 ENCOUNTER — Encounter: Payer: Self-pay | Admitting: Family Medicine

## 2017-11-23 ENCOUNTER — Encounter: Payer: Self-pay | Admitting: Family Medicine

## 2017-11-30 ENCOUNTER — Other Ambulatory Visit: Payer: Self-pay | Admitting: Family Medicine

## 2017-12-04 ENCOUNTER — Encounter: Payer: Self-pay | Admitting: Family Medicine

## 2017-12-11 ENCOUNTER — Encounter: Payer: Self-pay | Admitting: Family Medicine

## 2017-12-11 ENCOUNTER — Ambulatory Visit: Payer: PRIVATE HEALTH INSURANCE | Admitting: Family Medicine

## 2017-12-11 DIAGNOSIS — E1165 Type 2 diabetes mellitus with hyperglycemia: Secondary | ICD-10-CM

## 2017-12-11 MED ORDER — INSULIN DETEMIR 100 UNIT/ML FLEXPEN: 20.0000 [IU] | PEN_INJECTOR | Freq: Every day | SUBCUTANEOUS | 5 refills | Status: DC

## 2017-12-11 NOTE — Patient Instructions (Addendum)
Schedule a lab only visit for next month  Schedule your complete physical for anytime after September 18  Please schedule your eye exam- need annually

## 2017-12-11 NOTE — Progress Notes (Signed)
   Subjective:    Patient ID: Janice JarvisJennifer Y Greene, female    DOB: 04/07/1966, 52 y.o.   MRN: [id number]  HPI This is a 52 yo female who presents today for follow up of hyperglycemia and recent insulin initiation.  Was having headaches but they have eased off.  She brings in her readings from last week. Had one reading over 300, a couple readings over 200, most in 100s. Afternoon readings lowest. Dinner is her biggest meal. Walking at work when she can. Taking insulin detemir 14-18 units nightly. Last hgba1c 4/19- 10.3. Feels less bloated and more energetic.   Has had decreased bowel movements since starting insulin/synthroid. Rarely has to take laxative. Drinking more water, usually 2 bottles a day.     Past Medical History:  Diagnosis Date  . Anxiety   . Borderline diabetes   . Diabetes mellitus without complication (HCC)   . Headache(784.0)   . History of blood transfusion    after childbirth  . Hyperlipidemia   . Palpitations    Past Surgical History:  Procedure Laterality Date  . AUGMENTATION MAMMAPLASTY Bilateral   . CHOLECYSTECTOMY  1989  . TUBAL LIGATION  1992   Family History  Problem Relation Age of Onset  . Colon cancer Brother 7165  . Heart attack Brother   . Arrhythmia Brother   . Heart disease Father   . Heart attack Father   . Heart disease Mother   . Arrhythmia Sister    Social History   Tobacco Use  . Smoking status: Former Smoker    Packs/day: 0.25    Years: 10.00    Pack years: 2.50    Types: Cigarettes  . Smokeless tobacco: Never Used  . Tobacco comment: Previous social smoker  Substance Use Topics  . Alcohol use: No    Alcohol/week: 0.0 oz  . Drug use: No      Review of Systems Per HPI    Objective:   Physical Exam  Constitutional: She is oriented to person, place, and time. She appears well-developed and well-nourished. No distress.  HENT:  Head: Normocephalic and atraumatic.  Eyes: Conjunctivae are normal.  Cardiovascular: Normal rate.   Pulmonary/Chest: Effort normal.  Neurological: She is alert and oriented to person, place, and time.  Skin: Skin is warm and dry. She is not diaphoretic.  Psychiatric: She has a normal mood and affect. Her behavior is normal. Judgment and thought content normal.  Vitals reviewed.     BP 118/68   Pulse 75   Temp 98.4 F (36.9 C) (Oral)   Ht 5\' 4"  (1.626 m)   Wt 156 lb (70.8 kg)   BMI 26.78 kg/m  Wt Readings from Last 3 Encounters:  12/11/17 156 lb (70.8 kg)  10/16/17 158 lb (71.7 kg)  05/08/17 161 lb (73 kg)       Assessment & Plan:  1. Uncontrolled type 2 diabetes mellitus with hyperglycemia (HCC) - will check hgba1c next month and follow up for CPE 9/19 - discussed food choices, spreading out meals, eating smaller portions, increased vegetables, lean proteins, goals for blood sugars less than 200, increasing activity level - Insulin Detemir (LEVEMIR) 100 UNIT/ML Pen; Inject 20 Units into the skin daily at 10 pm.  Dispense: 2 pen; Refill: 5 - Hemoglobin A1c; Future  Olean Reeeborah Semya Klinke, FNP-BC  [facility]  12/11/2017 1:34 PM

## 2017-12-12 ENCOUNTER — Other Ambulatory Visit: Payer: Self-pay | Admitting: Family Medicine

## 2017-12-27 ENCOUNTER — Other Ambulatory Visit: Payer: Self-pay | Admitting: Family Medicine

## 2017-12-28 NOTE — Telephone Encounter (Signed)
DG-Plz see refill req/thx dmf 

## 2018-01-08 ENCOUNTER — Encounter: Payer: Self-pay | Admitting: Family Medicine

## 2018-01-11 ENCOUNTER — Other Ambulatory Visit: Payer: PRIVATE HEALTH INSURANCE

## 2018-01-16 ENCOUNTER — Encounter: Payer: Self-pay | Admitting: Family Medicine

## 2018-01-17 ENCOUNTER — Other Ambulatory Visit: Payer: Self-pay | Admitting: Family Medicine

## 2018-01-17 MED ORDER — FENOFIBRATE 54 MG PO TABS: 54.0000 mg | ORAL_TABLET | Freq: Two times a day (BID) | ORAL | 3 refills | Status: DC

## 2018-01-18 ENCOUNTER — Telehealth: Payer: Self-pay | Admitting: Family Medicine

## 2018-01-18 MED ORDER — SUMATRIPTAN SUCCINATE 100 MG PO TABS: ORAL_TABLET | ORAL | 0 refills | Status: DC

## 2018-01-18 NOTE — Telephone Encounter (Signed)
Copied from CRM [id number]. Topic: Quick Communication - Rx Refill/Question >> Jan 18, 2018  8:53 AM Stephannie LiSimmons, Felicidad Sugarman L, NT wrote: Medication:  SUMAtriptan (IMITREX) 100 MG tablet Has the patient contacted their pharmacy?yes  ,the patient was told they did not receive her request for a refill (Agent: If no, request that the patient contact the pharmacy for the refill.  (Agent: If yes, when and what did the pharmacy advise?  Preferred Pharmacy (with phone number or street name [facility] [phone] - [place], [place] - [address] AT [phone] (Phone) [phone] (Fax)      Agent: Please be advised that RX refills may take up to 3 business days. We ask that you follow-up with your pharmacy.

## 2018-02-01 ENCOUNTER — Other Ambulatory Visit (INDEPENDENT_AMBULATORY_CARE_PROVIDER_SITE_OTHER): Payer: PRIVATE HEALTH INSURANCE

## 2018-02-01 DIAGNOSIS — E1165 Type 2 diabetes mellitus with hyperglycemia: Secondary | ICD-10-CM

## 2018-02-01 LAB — HEMOGLOBIN A1C: Hgb A1c MFr Bld: 8.5 % — ABNORMAL HIGH (ref 4.6–6.5)

## 2018-02-06 ENCOUNTER — Encounter: Payer: Self-pay | Admitting: Family Medicine

## 2018-02-07 ENCOUNTER — Other Ambulatory Visit: Payer: Self-pay | Admitting: Family Medicine

## 2018-02-07 MED ORDER — GABAPENTIN 300 MG PO CAPS
300.0000 mg | ORAL_CAPSULE | Freq: Every day | ORAL | 3 refills | Status: DC
Start: 2018-02-07 — End: 2019-03-04

## 2018-02-20 ENCOUNTER — Other Ambulatory Visit: Payer: Self-pay | Admitting: Family Medicine

## 2018-02-22 NOTE — Telephone Encounter (Signed)
Name of Medication: Imitrex 100 mg Name of Pharmacy: [facility] Last Fill or Written Date and Quantity: # 9 on 01/18/18 Last Office Visit and Type: 12/11/17 FU on diabetes Next Office Visit and Type: 03/23/18 CPX  I spoke with pt and she is out of medication and sent to provider in the office.

## 2018-02-22 NOTE — Telephone Encounter (Signed)
Refill of Imitrex  LOV 12/11/17 D. [facility]  #9  0 refills  [id number] [address]  [place]

## 2018-02-22 NOTE — Telephone Encounter (Signed)
Patient called to follow up request for previous request for refill of  SUMAtriptan Succinate

## 2018-02-22 NOTE — Telephone Encounter (Signed)
Pt notified refill imitrex was sent to [facility] and pt voiced understanding and was very appreciative.

## 2018-03-22 ENCOUNTER — Other Ambulatory Visit: Payer: Self-pay | Admitting: Family Medicine

## 2018-03-23 ENCOUNTER — Encounter: Payer: PRIVATE HEALTH INSURANCE | Admitting: Family Medicine

## 2018-03-25 ENCOUNTER — Other Ambulatory Visit: Payer: Self-pay | Admitting: Internal Medicine

## 2018-03-26 NOTE — Telephone Encounter (Signed)
This is a patient of Deboraha Sprangebbie Gessner, NP.  Patient is due for appointment this month and has next recheck set up for October.  I will refill X 1 month to carry through to next appt.  Thanks.   Fyi to provider only.

## 2018-04-03 ENCOUNTER — Encounter: Payer: Self-pay | Admitting: Family Medicine

## 2018-04-04 ENCOUNTER — Other Ambulatory Visit: Payer: Self-pay | Admitting: Family Medicine

## 2018-04-05 NOTE — Telephone Encounter (Signed)
DG-Plz see refill req/thx dmf 

## 2018-04-06 NOTE — Telephone Encounter (Signed)
Last filled 12/28/17 # 90 refills 0  Last Ov 12/11/17

## 2018-04-16 ENCOUNTER — Encounter: Payer: Self-pay | Admitting: Family Medicine

## 2018-04-25 ENCOUNTER — Ambulatory Visit (INDEPENDENT_AMBULATORY_CARE_PROVIDER_SITE_OTHER): Payer: PRIVATE HEALTH INSURANCE | Admitting: Family Medicine

## 2018-04-25 ENCOUNTER — Encounter: Payer: Self-pay | Admitting: Family Medicine

## 2018-04-25 VITALS — BP 124/76 | HR 80 | Temp 97.7°F | Ht 63.5 in | Wt 157.0 lb

## 2018-04-25 DIAGNOSIS — Z114 Encounter for screening for human immunodeficiency virus [HIV]: Secondary | ICD-10-CM

## 2018-04-25 DIAGNOSIS — Z0000 Encounter for general adult medical examination without abnormal findings: Secondary | ICD-10-CM

## 2018-04-25 DIAGNOSIS — R200 Anesthesia of skin: Secondary | ICD-10-CM

## 2018-04-25 DIAGNOSIS — Z23 Encounter for immunization: Secondary | ICD-10-CM

## 2018-04-25 DIAGNOSIS — E1165 Type 2 diabetes mellitus with hyperglycemia: Secondary | ICD-10-CM

## 2018-04-25 DIAGNOSIS — E785 Hyperlipidemia, unspecified: Secondary | ICD-10-CM

## 2018-04-25 DIAGNOSIS — E038 Other specified hypothyroidism: Secondary | ICD-10-CM

## 2018-04-25 DIAGNOSIS — E039 Hypothyroidism, unspecified: Secondary | ICD-10-CM | POA: Diagnosis not present

## 2018-04-25 DIAGNOSIS — R202 Paresthesia of skin: Secondary | ICD-10-CM

## 2018-04-25 DIAGNOSIS — L84 Corns and callosities: Secondary | ICD-10-CM

## 2018-04-25 DIAGNOSIS — L301 Dyshidrosis [pompholyx]: Secondary | ICD-10-CM

## 2018-04-25 MED ORDER — BETAMETHASONE VALERATE 0.1 % EX OINT: 1.0000 "application " | TOPICAL_OINTMENT | Freq: Two times a day (BID) | CUTANEOUS | 1 refills | Status: DC

## 2018-04-25 NOTE — Progress Notes (Signed)
Subjective:    Patient ID: Janice Greene, female    DOB: Mar 02, 1966, 52 y.o.   MRN: [id number]  HPI This is a 52 yo female who presents today for CPE.   Last CPE- 03/21/17 Mammo- 09/01/17 Pap- 03/21/17, negative, negative HPV Colonoscopy- 05/20/15 Tdap- 05/08/17 Flu- declines Eye- over due Dental- regular Exercise- walking during lunch  Past Medical History:  Diagnosis Date  . Anxiety   . Borderline diabetes   . Diabetes mellitus without complication (HCC)   . Headache(784.0)   . History of blood transfusion    after childbirth  . Hyperlipidemia   . Palpitations    Past Surgical History:  Procedure Laterality Date  . AUGMENTATION MAMMAPLASTY Bilateral   . CHOLECYSTECTOMY  1989  . TUBAL LIGATION  1992   Family History  Problem Relation Age of Onset  . Colon cancer Brother 75  . Heart attack Brother   . Arrhythmia Brother   . Heart disease Father   . Heart attack Father   . Heart disease Mother   . Arrhythmia Sister    Social History   Tobacco Use  . Smoking status: Former Smoker    Packs/day: 0.25    Years: 10.00    Pack years: 2.50    Types: Cigarettes  . Smokeless tobacco: Never Used  . Tobacco comment: Previous social smoker  Substance Use Topics  . Alcohol use: No    Alcohol/week: 0.0 standard drinks  . Drug use: No   Right wrist pain- is a little better, will hold off on orthopedic referral.   DM- blood sugars running 94-133. Taking insulin 15-20 units at bedtime.   TSH- not taking levothyroixine, made her stomach hurt, some constipation with fexofibrinate.   Constipation- off and on. Occasional laxative.    Review of Systems  Constitutional: Negative.   HENT: Negative.   Eyes: Negative.   Respiratory: Negative.   Cardiovascular: Negative.   Gastrointestinal: Positive for constipation (occasional, takes laxative with good relief, thinks worse with meds, stopped levothyroxine).  Endocrine: Negative.   Genitourinary: Negative for dysuria  and vaginal bleeding (LMP greater than 1 year ago).  Musculoskeletal:       Right arm pain, improved, thinks she slept funny. Occasional right hand tingling, improved with bracing, occasional ibuprofen.   Skin:       Intermittent peeling of hands, fingers. Doesn't seem related to contact, soaps, etc, little improvement with clobetasol in past. Uses lotion.   Allergic/Immunologic: Negative.   Neurological: Negative.   Hematological: Negative.   Psychiatric/Behavioral: Negative.        Objective:   Physical Exam Physical Exam  Constitutional: She is oriented to person, place, and time. She appears well-developed and well-nourished. No distress.  HENT:  Head: Normocephalic and atraumatic.  Right Ear: External ear normal.  Left Ear: External ear normal.  Nose: Nose normal.  Mouth/Throat: Oropharynx is clear and moist. No oropharyngeal exudate.  Eyes: Conjunctivae are normal. Pupils are equal, round, and reactive to light.  Neck: Normal range of motion. Neck supple. No JVD present. No thyromegaly present.  Cardiovascular: Normal rate, regular rhythm, normal heart sounds and intact distal pulses.   Pulmonary/Chest: Effort normal and breath sounds normal. Right breast exhibits no inverted nipple, no mass, no nipple discharge, no skin change and no tenderness. Left breast exhibits no inverted nipple, no mass, no nipple discharge, no skin change and no tenderness. Breasts are symmetrical.  Abdominal: Soft. Bowel sounds are normal. She exhibits no distension and no mass.  There is no tenderness. There is no rebound and no guarding.  Musculoskeletal: Normal range of motion. She exhibits no edema or tenderness.  Lymphadenopathy:    She has no cervical adenopathy.  Neurological: She is alert and oriented to person, place, and time. She has normal reflexes.  Skin: Skin is warm and dry. She is not diaphoretic. Bilateral hands with scattered areas thickened, peeling/flaking skin Psychiatric: She has a  normal mood and affect. Her behavior is normal. Judgment and thought content normal.  Vitals reviewed.  BP 124/76 (BP Location: Left Arm, Patient Position: Sitting, Cuff Size: Normal)   Pulse 80   Temp 97.7 F (36.5 C) (Oral)   Ht 5' 3.5" (1.613 m)   Wt 157 lb (71.2 kg)   SpO2 96%   BMI 27.38 kg/m  Wt Readings from Last 3 Encounters:  04/25/18 157 lb (71.2 kg)  12/11/17 156 lb (70.8 kg)  10/16/17 158 lb (71.7 kg)   Depression screen [facility] 2/9 04/25/2018 05/08/2017  Decreased Interest 0 0  Down, Depressed, Hopeless 0 0  PHQ - 2 Score 0 0   Diabetic foot exam: Normal inspection No skin breakdown Bilateral heels with thickened areas, cracking. No erythema.  Normal DP pulses Normal sensation to light touch  Nails normal     Assessment & Plan:  1. Annual physical exam - Discussed and encouraged healthy lifestyle choices- adequate sleep, regular exercise, stress management and healthy food choices.    2. Need for 23-polyvalent pneumococcal polysaccharide vaccine - Pneumococcal polysaccharide vaccine 23-valent greater than or equal to 2yo subcutaneous/IM  3. Uncontrolled type 2 diabetes mellitus with hyperglycemia (HCC) - Discussed importance of annual eye exam, she will schedule - Too early to check labs, will return fasting in 2 weeks - Hemoglobin A1c; Future - Microalbumin / creatinine urine ratio; Future - Lipid Panel; Future - Basic Metabolic Panel; Future  4. Subclinical hypothyroidism - TSH; Future  5. Numbness and tingling in right hand - improved, will hold off on referral for now, she will let me know if she changes her mid  6. Hyperlipidemia, unspecified hyperlipidemia type - Lipid Panel; Future  7. Dyshidrotic eczema - betamethasone valerate ointment (VALISONE) 0.1 %; Apply 1 application topically 2 (two) times daily. For no more than 10 days  Dispense: 45 g; Refill: 1  8. Heel callus - encouraged her to moisturize nightly examine feet for skin break  down  9. Screening for HIV without presence of risk factors - HIV Antibody (routine testing w rflx); Future  - RTC in 6 months from lab draw date  Olean Ree, FNP-BC  [facility], [facility]  04/25/2018 9:15 AM

## 2018-04-25 NOTE — Patient Instructions (Addendum)
Please schedule your eye exam, I like [facility] [place] [facility]  Follow up for fasting labs after 05/04/18  Let me know if you want an orthopedic referral for your arm  Schedule a follow up visit 6 months after your labs are drawn- can make on same day   Preventive Care 40-64 Years, Female Preventive care refers to lifestyle choices and visits with your health care provider that can promote health and wellness. What does preventive care include?  A yearly physical exam. This is also called an annual well check.  Dental exams once or twice a year.  Routine eye exams. Ask your health care provider how often you should have your eyes checked.  Personal lifestyle choices, including: ? Daily care of your teeth and gums. ? Regular physical activity. ? Eating a healthy diet. ? Avoiding tobacco and drug use. ? Limiting alcohol use. ? Practicing safe sex. ? Taking low-dose aspirin daily starting at age 44. ? Taking vitamin and mineral supplements as recommended by your health care provider. What happens during an annual well check? The services and screenings done by your health care provider during your annual well check will depend on your age, overall health, lifestyle risk factors, and family history of disease. Counseling Your health care provider may ask you questions about your:  Alcohol use.  Tobacco use.  Drug use.  Emotional well-being.  Home and relationship well-being.  Sexual activity.  Eating habits.  Work and work Statistician.  Method of birth control.  Menstrual cycle.  Pregnancy history.  Screening You may have the following tests or measurements:  Height, weight, and BMI.  Blood pressure.  Lipid and cholesterol levels. These may be checked every 5 years, or more frequently if you are over 47 years old.  Skin check.  Lung cancer screening. You may have this screening every year starting at age 10 if you have a 30-pack-year history of  smoking and currently smoke or have quit within the past 15 years.  Fecal occult blood test (FOBT) of the stool. You may have this test every year starting at age 88.  Flexible sigmoidoscopy or colonoscopy. You may have a sigmoidoscopy every 5 years or a colonoscopy every 10 years starting at age 23.  Hepatitis C blood test.  Hepatitis B blood test.  Sexually transmitted disease (STD) testing.  Diabetes screening. This is done by checking your blood sugar (glucose) after you have not eaten for a while (fasting). You may have this done every 1-3 years.  Mammogram. This may be done every 1-2 years. Talk to your health care provider about when you should start having regular mammograms. This may depend on whether you have a family history of breast cancer.  BRCA-related cancer screening. This may be done if you have a family history of breast, ovarian, tubal, or peritoneal cancers.  Pelvic exam and Pap test. This may be done every 3 years starting at age 68. Starting at age 64, this may be done every 5 years if you have a Pap test in combination with an HPV test.  Bone density scan. This is done to screen for osteoporosis. You may have this scan if you are at high risk for osteoporosis.  Discuss your test results, treatment options, and if necessary, the need for more tests with your health care provider. Vaccines Your health care provider may recommend certain vaccines, such as:  Influenza vaccine. This is recommended every year.  Tetanus, diphtheria, and acellular pertussis (Tdap, Td)  vaccine. You may need a Td booster every 10 years.  Varicella vaccine. You may need this if you have not been vaccinated.  Zoster vaccine. You may need this after age 75.  Measles, mumps, and rubella (MMR) vaccine. You may need at least one dose of MMR if you were born in 1957 or later. You may also need a second dose.  Pneumococcal 13-valent conjugate (PCV13) vaccine. You may need this if you have  certain conditions and were not previously vaccinated.  Pneumococcal polysaccharide (PPSV23) vaccine. You may need one or two doses if you smoke cigarettes or if you have certain conditions.  Meningococcal vaccine. You may need this if you have certain conditions.  Hepatitis A vaccine. You may need this if you have certain conditions or if you travel or work in places where you may be exposed to hepatitis A.  Hepatitis B vaccine. You may need this if you have certain conditions or if you travel or work in places where you may be exposed to hepatitis B.  Haemophilus influenzae type b (Hib) vaccine. You may need this if you have certain conditions.  Talk to your health care provider about which screenings and vaccines you need and how often you need them. This information is not intended to replace advice given to you by your health care provider. Make sure you discuss any questions you have with your health care provider. Document Released: 07/17/2015 Document Revised: 03/09/2016 Document Reviewed: 04/21/2015 Elsevier Interactive Patient Education  [employer].

## 2018-05-02 ENCOUNTER — Other Ambulatory Visit: Payer: Self-pay | Admitting: Family Medicine

## 2018-05-17 ENCOUNTER — Encounter: Payer: Self-pay | Admitting: Family Medicine

## 2018-05-23 ENCOUNTER — Other Ambulatory Visit (INDEPENDENT_AMBULATORY_CARE_PROVIDER_SITE_OTHER): Payer: PRIVATE HEALTH INSURANCE

## 2018-05-23 DIAGNOSIS — Z114 Encounter for screening for human immunodeficiency virus [HIV]: Secondary | ICD-10-CM

## 2018-05-23 DIAGNOSIS — E785 Hyperlipidemia, unspecified: Secondary | ICD-10-CM

## 2018-05-23 DIAGNOSIS — E1165 Type 2 diabetes mellitus with hyperglycemia: Secondary | ICD-10-CM | POA: Diagnosis not present

## 2018-05-23 DIAGNOSIS — E039 Hypothyroidism, unspecified: Secondary | ICD-10-CM | POA: Diagnosis not present

## 2018-05-23 DIAGNOSIS — E038 Other specified hypothyroidism: Secondary | ICD-10-CM

## 2018-05-23 LAB — LIPID PANEL
CHOLESTEROL: 158 mg/dL (ref 0–200)
HDL: 37.8 mg/dL — ABNORMAL LOW (ref >39.00)
NonHDL: 120.55
TRIGLYCERIDES: 254 mg/dL — AB (ref 0.0–149.0)
Total CHOL/HDL Ratio: 4
VLDL: 50.8 mg/dL — ABNORMAL HIGH (ref 0.0–40.0)

## 2018-05-23 LAB — BASIC METABOLIC PANEL
BUN: 19 mg/dL (ref 6–23)
CO2: 26 mEq/L (ref 19–32)
Calcium: 9 mg/dL (ref 8.4–10.5)
Chloride: 104 mEq/L (ref 96–112)
Creatinine, Ser: 1.07 mg/dL (ref 0.40–1.20)
GFR: 56.72 mL/min — AB (ref >60.00)
Glucose, Bld: 276 mg/dL — ABNORMAL HIGH (ref 70–99)
POTASSIUM: 4.4 meq/L (ref 3.5–5.1)
SODIUM: 139 meq/L (ref 135–145)

## 2018-05-23 LAB — LDL CHOLESTEROL, DIRECT: Direct LDL: 84 mg/dL

## 2018-05-23 LAB — HEMOGLOBIN A1C: HEMOGLOBIN A1C: 8.9 % — AB (ref 4.6–6.5)

## 2018-05-23 LAB — TSH: TSH: 5.28 u[IU]/mL — AB (ref 0.35–4.50)

## 2018-05-23 NOTE — Addendum Note (Signed)
Addended by: Damita LackLORING, DONNA S on: 05/23/2018 08:17 AM   Modules accepted: Orders

## 2018-05-24 ENCOUNTER — Encounter: Payer: Self-pay | Admitting: Family Medicine

## 2018-05-24 LAB — HIV ANTIBODY (ROUTINE TESTING W REFLEX): HIV 1&2 Ab, 4th Generation: NONREACTIVE

## 2018-05-25 ENCOUNTER — Other Ambulatory Visit (INDEPENDENT_AMBULATORY_CARE_PROVIDER_SITE_OTHER): Payer: PRIVATE HEALTH INSURANCE

## 2018-05-25 DIAGNOSIS — E1165 Type 2 diabetes mellitus with hyperglycemia: Secondary | ICD-10-CM

## 2018-05-25 LAB — MICROALBUMIN / CREATININE URINE RATIO
CREATININE, U: 92.8 mg/dL
MICROALB UR: 0.9 mg/dL (ref 0.0–1.9)
MICROALB/CREAT RATIO: 1 mg/g (ref 0.0–30.0)

## 2018-05-28 ENCOUNTER — Encounter: Payer: Self-pay | Admitting: Family Medicine

## 2018-05-28 ENCOUNTER — Other Ambulatory Visit: Payer: Self-pay | Admitting: Family Medicine

## 2018-05-28 MED ORDER — GLYBURIDE-METFORMIN 5-500 MG PO TABS: 2.0000 | ORAL_TABLET | Freq: Two times a day (BID) | ORAL | 1 refills | Status: DC

## 2018-05-28 MED ORDER — ROSUVASTATIN CALCIUM 10 MG PO TABS: 10.0000 mg | ORAL_TABLET | Freq: Every day | ORAL | 3 refills | Status: DC

## 2018-05-28 NOTE — Addendum Note (Signed)
Addended by: Olean ReeGESSNER, Geordie Nooney B on: 05/28/2018 08:26 AM   Modules accepted: Orders

## 2018-06-04 ENCOUNTER — Encounter: Payer: Self-pay | Admitting: Family Medicine

## 2018-06-20 ENCOUNTER — Other Ambulatory Visit: Payer: Self-pay | Admitting: Family Medicine

## 2018-07-17 ENCOUNTER — Other Ambulatory Visit: Payer: Self-pay | Admitting: Family Medicine

## 2018-07-17 ENCOUNTER — Encounter: Payer: Self-pay | Admitting: Family Medicine

## 2018-07-17 NOTE — Telephone Encounter (Signed)
Electronic refill request Imitrex Last refill 06/21/18 #9 Last office visit 04/25/18

## 2018-07-18 NOTE — Telephone Encounter (Signed)
Electronic refill request Cyclobenzaprine Last office visit 04/25/18 Last refill 04/06/18 #90

## 2018-08-13 ENCOUNTER — Other Ambulatory Visit: Payer: Self-pay | Admitting: Family Medicine

## 2018-08-14 NOTE — Telephone Encounter (Signed)
Electronic refill request Imitrex Last refill 07/17/18 #9 Last office visit 04/25/18

## 2018-09-12 ENCOUNTER — Other Ambulatory Visit: Payer: Self-pay | Admitting: Family Medicine

## 2018-09-14 ENCOUNTER — Other Ambulatory Visit: Payer: Self-pay

## 2018-09-14 MED ORDER — SUMATRIPTAN SUCCINATE 100 MG PO TABS: ORAL_TABLET | ORAL | 0 refills | Status: DC

## 2018-09-14 NOTE — Telephone Encounter (Signed)
Pharmacy received prescription and will call patient when ready to be picked up

## 2018-09-14 NOTE — Telephone Encounter (Signed)
Pt left v/m requesting cb with status of sumatriptan refill.

## 2018-10-09 ENCOUNTER — Other Ambulatory Visit: Payer: Self-pay | Admitting: Family Medicine

## 2018-10-09 DIAGNOSIS — E1165 Type 2 diabetes mellitus with hyperglycemia: Secondary | ICD-10-CM

## 2018-10-10 ENCOUNTER — Telehealth: Payer: Self-pay | Admitting: Radiology

## 2018-10-10 NOTE — Telephone Encounter (Signed)
LM pt to schedule a lab appt and a doxy.me with Deboraha Sprang

## 2018-10-11 ENCOUNTER — Telehealth: Payer: Self-pay

## 2018-10-11 NOTE — Telephone Encounter (Signed)
Left detailed VM with COVID screen and curbside info  

## 2018-10-14 ENCOUNTER — Other Ambulatory Visit: Payer: Self-pay | Admitting: Family Medicine

## 2018-10-15 ENCOUNTER — Other Ambulatory Visit: Payer: Self-pay | Admitting: Family Medicine

## 2018-10-15 DIAGNOSIS — E1165 Type 2 diabetes mellitus with hyperglycemia: Secondary | ICD-10-CM

## 2018-10-15 DIAGNOSIS — E7800 Pure hypercholesterolemia, unspecified: Secondary | ICD-10-CM

## 2018-10-15 DIAGNOSIS — R7989 Other specified abnormal findings of blood chemistry: Secondary | ICD-10-CM

## 2018-10-15 NOTE — Telephone Encounter (Signed)
She has labs/virtual visit this week.

## 2018-10-15 NOTE — Telephone Encounter (Signed)
imitrex last filled 09/02/18, pt is due for 6 mth f/u next month... will message her to schedule this... please advise

## 2018-10-16 ENCOUNTER — Other Ambulatory Visit: Payer: Self-pay

## 2018-10-16 ENCOUNTER — Other Ambulatory Visit (INDEPENDENT_AMBULATORY_CARE_PROVIDER_SITE_OTHER): Payer: PRIVATE HEALTH INSURANCE

## 2018-10-16 DIAGNOSIS — E1165 Type 2 diabetes mellitus with hyperglycemia: Secondary | ICD-10-CM

## 2018-10-16 DIAGNOSIS — E7800 Pure hypercholesterolemia, unspecified: Secondary | ICD-10-CM

## 2018-10-16 DIAGNOSIS — R7989 Other specified abnormal findings of blood chemistry: Secondary | ICD-10-CM

## 2018-10-16 LAB — LIPID PANEL
Cholesterol: 113 mg/dL (ref 0–200)
HDL: 39.4 mg/dL (ref >39.00)
LDL Cholesterol: 41 mg/dL (ref 0–99)
NonHDL: 73.6
Total CHOL/HDL Ratio: 3
Triglycerides: 164 mg/dL — ABNORMAL HIGH (ref 0.0–149.0)
VLDL: 32.8 mg/dL (ref 0.0–40.0)

## 2018-10-16 LAB — BASIC METABOLIC PANEL
BUN: 17 mg/dL (ref 6–23)
CO2: 27 mEq/L (ref 19–32)
Calcium: 9.8 mg/dL (ref 8.4–10.5)
Chloride: 104 mEq/L (ref 96–112)
Creatinine, Ser: 0.98 mg/dL (ref 0.40–1.20)
GFR: 58.97 mL/min — ABNORMAL LOW (ref >60.00)
Glucose, Bld: 200 mg/dL — ABNORMAL HIGH (ref 70–99)
Potassium: 5.1 mEq/L (ref 3.5–5.1)
Sodium: 140 mEq/L (ref 135–145)

## 2018-10-16 LAB — TSH: TSH: 5.07 u[IU]/mL — ABNORMAL HIGH (ref 0.35–4.50)

## 2018-10-16 LAB — HEMOGLOBIN A1C: Hgb A1c MFr Bld: 8.9 % — ABNORMAL HIGH (ref 4.6–6.5)

## 2018-10-17 ENCOUNTER — Other Ambulatory Visit: Payer: Self-pay | Admitting: Family Medicine

## 2018-10-19 ENCOUNTER — Encounter: Payer: Self-pay | Admitting: Family Medicine

## 2018-10-19 ENCOUNTER — Ambulatory Visit (INDEPENDENT_AMBULATORY_CARE_PROVIDER_SITE_OTHER): Payer: PRIVATE HEALTH INSURANCE | Admitting: Family Medicine

## 2018-10-19 VITALS — Wt 156.0 lb

## 2018-10-19 DIAGNOSIS — R7989 Other specified abnormal findings of blood chemistry: Secondary | ICD-10-CM

## 2018-10-19 DIAGNOSIS — M79604 Pain in right leg: Secondary | ICD-10-CM

## 2018-10-19 DIAGNOSIS — E1165 Type 2 diabetes mellitus with hyperglycemia: Secondary | ICD-10-CM

## 2018-10-19 DIAGNOSIS — M79605 Pain in left leg: Secondary | ICD-10-CM

## 2018-10-19 DIAGNOSIS — E038 Other specified hypothyroidism: Secondary | ICD-10-CM

## 2018-10-19 DIAGNOSIS — G2581 Restless legs syndrome: Secondary | ICD-10-CM

## 2018-10-19 DIAGNOSIS — E039 Hypothyroidism, unspecified: Secondary | ICD-10-CM

## 2018-10-19 MED ORDER — INSULIN DETEMIR 100 UNIT/ML FLEXPEN: 24.0000 [IU] | PEN_INJECTOR | Freq: Every day | SUBCUTANEOUS | 5 refills | Status: DC

## 2018-10-19 MED ORDER — LEVOTHYROXINE SODIUM 75 MCG PO TABS: 75.0000 ug | ORAL_TABLET | Freq: Every day | ORAL | 3 refills | Status: DC

## 2018-10-19 NOTE — Progress Notes (Signed)
Virtual Visit via Video Note  I connected with Janice Greene on 10/19/18 at  9:30 AM EDT by a video enabled telemedicine application and verified that I am speaking with the correct person using two identifiers.The patient was at her home and I was in my office.    I discussed the limitations of evaluation and management by telemedicine and the availability of in person appointments. The patient expressed understanding and agreed to proceed.  History of Present Illness: This is a 53 year old female who has virtual visit today to follow-up on diabetes, hypothyroidism.    DM type 2- She had labs done prior to video visit.  Hemoglobin's A1c stable at 8.9 on Levemir 20 units nightlyand Glucovance 5-500, two tablets twice a day. She reports that her home readings are between 90- 200. She has been feeling a little down with pandemic. Little exercise. She consumes a fair number of carbs daily, eating cereal or bread for breakfast.   TSH remains mildly elevated at greater than 5 on 50 mcg of levothyroxine.  She has bilateral hip pain and legs ache. History of RLS, doesn't occur every night. She is unable to identify triggers. She is taking gabapentin 300 mg nightly and thinks it helps some. She takes occasional ibuprofen 400 mg with some relief of pain. Her SIL is PT and has tried to get her to do some exercises and has told her that her hips are tight. She enjoys sitting on the floor and has noticed that she is more stiff with getting up.  She is also concerned that leg pain is related to rosuvastatin.     Observations/Objective: The patient is alert and answers questions appropriately. Visible skin is unremarkable. Normally converses without shortness of breath. Mood and affect appropriate.   Wt Readings from Last 3 Encounters:  10/19/18 156 lb (70.8 kg)  04/25/18 157 lb (71.2 kg)  12/11/17 156 lb (70.8 kg)   Assessment and Plan: 1. Uncontrolled type 2 diabetes mellitus with hyperglycemia  (HCC) - will increase levemir to 24 units and encouraged decreased carbohydrate intake, increased walking - Insulin Detemir (LEVEMIR FLEXTOUCH) 100 UNIT/ML Pen; Inject 24 Units into the skin at bedtime.  Dispense: 15 mL; Refill: 5  2. RLS (restless legs syndrome) - continue gabapentin 300 mg qhs - sent additional information regarding additional interventions and will check ferritin with next blood draw  3. Elevated TSH - will increase levothyroxine dose from 50-75 mcg  - levothyroxine (SYNTHROID) 75 MCG tablet; Take 1 tablet (75 mcg total) by mouth daily.  Dispense: 90 tablet; Refill: 3  4. Bilateral leg pain - She will stop rosuvastatin for 2 weeks and if no improvement, will restart. If improvement, she will contact me for additional considerations - discussed otc analgesics, exercises  Follow up in 3 months  Janice Ree, FNP-BC  [facility], [facility]  10/19/2018 10:00 PM   Follow Up Instructions:    I discussed the assessment and treatment plan with the patient. The patient was provided an opportunity to ask questions and all were answered. The patient agreed with the plan and demonstrated an understanding of the instructions.   The patient was advised to call back or seek an in-person evaluation if the symptoms worsen or if the condition fails to improve as anticipated.  Janice Belfast, FNP

## 2018-10-23 ENCOUNTER — Other Ambulatory Visit: Payer: Self-pay | Admitting: Family Medicine

## 2018-10-23 DIAGNOSIS — E1165 Type 2 diabetes mellitus with hyperglycemia: Secondary | ICD-10-CM

## 2018-11-13 ENCOUNTER — Other Ambulatory Visit: Payer: Self-pay | Admitting: Family Medicine

## 2018-11-14 NOTE — Telephone Encounter (Signed)
Refill request for Imitrex. Last filled on 10/15/2018 for 9 tablets with 0 additional refills. LOV 10/19/2018 for routine follow up. No future appointments. Please review

## 2018-11-14 NOTE — Telephone Encounter (Signed)
Please call patient and find out how often she is having migraines. Does she have to take 2 sumatriptan to get relief? If she is having more than 2 a month, needs to consider preventative medication.

## 2018-11-14 NOTE — Telephone Encounter (Signed)
Left message for patient to call back  

## 2018-11-15 NOTE — Telephone Encounter (Signed)
Spoke with patient. She tried to call back yesterday but could not get through. Patient states she usually gets this medication refilled pretty much monthly to the day of last refill. She only had to take 2 tablets for her headache one day this month. Patient states with allergy/sinus season headaches get worse and if she not bale to get it to go away with sinus medications, etc. then she takes Imitrex. Patient states she has been taking Flexeril 1 at bedtime for a long time now and that was suppose to help keep headaches at bay but she is not sure if that is even working anymore. This medication and gabapentin was helping her sleep but not anymore. She is having to take Melatonin and still having trouble falling asleep. Thinks maybe headaches are worse at times from lack of sleep too. Patient is out of Imitrex medication and is going out of town tomorrow and would like to get this filled today, she does not like to not have this medication on hand just in case.  Sending to Olean Ree, NP to review (hopeflly today) and another provider to see if they can address it for PCP.

## 2018-11-15 NOTE — Telephone Encounter (Signed)
Left message for patient to call back  

## 2018-11-15 NOTE — Telephone Encounter (Signed)
Patient advised and patient asked to be scheduled for first week of June. Appointment made

## 2018-11-15 NOTE — Telephone Encounter (Signed)
I will refill her Imitrex, but she will need to schedule a visit with Debbie to discuss treatment for recurrent headaches/migraines. Please schedule. Rx sent to pharmacy.

## 2018-12-07 ENCOUNTER — Ambulatory Visit: Payer: PRIVATE HEALTH INSURANCE | Admitting: Family Medicine

## 2018-12-11 ENCOUNTER — Other Ambulatory Visit: Payer: Self-pay | Admitting: Primary Care

## 2018-12-12 NOTE — Telephone Encounter (Signed)
Last prescribed on 11/15/2018 by Allie Bossier . Last appointment on 10/19/2018. Next future appointment on 12/14/2018

## 2018-12-14 ENCOUNTER — Telehealth: Payer: Self-pay

## 2018-12-14 ENCOUNTER — Ambulatory Visit (INDEPENDENT_AMBULATORY_CARE_PROVIDER_SITE_OTHER): Payer: PRIVATE HEALTH INSURANCE | Admitting: Family Medicine

## 2018-12-14 ENCOUNTER — Encounter: Payer: Self-pay | Admitting: Family Medicine

## 2018-12-14 DIAGNOSIS — I83892 Varicose veins of left lower extremities with other complications: Secondary | ICD-10-CM | POA: Diagnosis not present

## 2018-12-14 DIAGNOSIS — E1165 Type 2 diabetes mellitus with hyperglycemia: Secondary | ICD-10-CM | POA: Diagnosis not present

## 2018-12-14 DIAGNOSIS — G43909 Migraine, unspecified, not intractable, without status migrainosus: Secondary | ICD-10-CM | POA: Diagnosis not present

## 2018-12-14 MED ORDER — AMITRIPTYLINE HCL 10 MG PO TABS: 10.0000 mg | ORAL_TABLET | Freq: Every day | ORAL | 1 refills | Status: DC

## 2018-12-14 NOTE — Telephone Encounter (Signed)
Janice Greene completed a Doxy.Me appointment with Jackelyn Poling today  At 8:00 am.

## 2018-12-14 NOTE — Progress Notes (Signed)
Virtual Visit via Video Note  I connected with Janice Greene on 12/14/18 at  8:00 AM EDT by a video enabled telemedicine application and verified that I am speaking with the correct person using two identifiers.  Location: Patient: In her home Provider: [place]   I discussed the limitations of evaluation and management by telemedicine and the availability of in person appointments. The patient expressed understanding and agreed to proceed.  History of Present Illness: This is a 53 yo female who requests virtual visit to discuss migraines.   Migraines-she has had migraines for over 15 years.  She was previously on atenolol which did not give her any improvement and on topiramate which caused her to have numbness and tingling in her feet and hands.  Currently she takes gabapentin 300 mg at and cyclobenzaprine 10 mg at bedtime.  She also has restless legs and takes the gabapentin for this.  She is currently having migraines twice a week.  She gets good relief with sumatriptan hand if she does not awaken with a migraine.  She has been told in the past that these are vascular headaches.  She has been able to identify some triggers including barometric pressure changes as well as noxious odors and poor sleep.  She currently drinks about 2 bottles of diet [facility] and about 24 ounces of water daily.  She is ramping back up to full-time at work and this has been a little stressful.  Diabetes- blood sugar was elevated April 2020 and we increased her insulin.  She reports improved blood sugars with morning blood sugars running around 130 and no readings greater than 200.  She did have a episode of blood sugar at 81 and she did not feel well until she had something to eat.  Varicose veins-she has had these in her left leg since her daughter was born 50 years ago.  She reports some increased swelling and occasional pain.  She has not tried compression socks.  She saw vascular surgeon a number  of years ago and was told there was no treatment for them at that time due to the size.   Past Medical History:  Diagnosis Date  . Anxiety   . Borderline diabetes   . Diabetes mellitus without complication ([place])   . Headache(784.0)   . History of blood transfusion    after childbirth  . Hyperlipidemia   . Palpitations    Past Surgical History:  Procedure Laterality Date  . AUGMENTATION MAMMAPLASTY Bilateral   . CHOLECYSTECTOMY  1989  . TUBAL LIGATION  1992   Family History  Problem Relation Age of Onset  . Colon cancer Brother 69  . Heart attack Brother   . Arrhythmia Brother   . Heart disease Father   . Heart attack Father   . Heart disease Mother   . Arrhythmia Sister    Social History   Tobacco Use  . Smoking status: Former Smoker    Packs/day: 0.25    Years: 10.00    Pack years: 2.50    Types: Cigarettes  . Smokeless tobacco: Never Used  . Tobacco comment: Previous social smoker  Substance Use Topics  . Alcohol use: No    Alcohol/week: 0.0 standard drinks  . Drug use: No      Observations/Objective: The patient was alert and answers questions appropriately.  Visible skin was unremarkable.  She was normally conversive without any shortness of breath.  Mood and affect were appropriate. There were no vitals  taken for this visit. BP Readings from Last 3 Encounters:  04/25/18 124/76  12/11/17 118/68  10/16/17 118/76   Wt Readings from Last 3 Encounters:  10/19/18 156 lb (70.8 kg)  04/25/18 157 lb (71.2 kg)  12/11/17 156 lb (70.8 kg)    Assessment and Plan: 1. Migraine without status migrainosus, not intractable, unspecified migraine type -Discussed triggers and encouraged her to work to wean her caffeine and increase her water intake -I have asked her to keep a headache log and bring with her to her next visit -We will discontinue nightly Flexeril and start low-dose amitriptyline - amitriptyline (ELAVIL) 10 MG tablet; Take 1 tablet (10 mg total) by  mouth at bedtime.  Dispense: 90 tablet; Refill: 1 -Follow-up in 6 to 8 weeks  2. Uncontrolled type 2 diabetes mellitus with hyperglycemia (HCC) -Home blood sugars improved -Follow-up in 6 to 8 weeks  3. Symptomatic varicose veins, left -Encouraged her to wear mild compression socks -Discussed referral to vascular surgery and she will hold off at this time as she has just gotten back to work full-time she will let me know if she changes her mind   Olean Reeeborah Jenney Brester, FNP-BC  [facility], [facility]  12/14/2018 8:41 AM   Follow Up Instructions: Visit recap sent to patient via my chart.   I discussed the assessment and treatment plan with the patient. The patient was provided an opportunity to ask questions and all were answered. The patient agreed with the plan and demonstrated an understanding of the instructions.   The patient was advised to call back or seek an in-person evaluation if the symptoms worsen or if the condition fails to improve as anticipated.    Emi Belfasteborah B Champayne Kocian, FNP

## 2018-12-14 NOTE — Telephone Encounter (Signed)
[facility] Night - Client Nonclinical Telephone Record AccessNurse Client [facility] Night - Client Client Site [place] [facility] Physician Tor Netters- NP Contact Type Call Who Is Calling Patient / Member / Family / Caregiver Caller Name Cherylene Ferrufino Caller Phone Number [phone] Patient Name Janice Greene Patient DOB 05/13/1966 Call Type Message Only Information Provided Reason for Call Request for General Office Information Initial Comment Caller states that she is supposed to have a Zoom meeting today at [place] Please call her for further instruction on the zoom call. Call Closed By: Carma Lair Transaction Date/Time: 12/14/2018 7:46:37 AM (ET)

## 2018-12-26 ENCOUNTER — Other Ambulatory Visit: Payer: Self-pay | Admitting: Family Medicine

## 2019-01-17 ENCOUNTER — Encounter: Payer: Self-pay | Admitting: Family Medicine

## 2019-01-17 NOTE — Telephone Encounter (Signed)
Janice Greene, should patient schedule a visit? (virtual or in office?)

## 2019-01-18 ENCOUNTER — Encounter: Payer: Self-pay | Admitting: Family Medicine

## 2019-01-18 ENCOUNTER — Ambulatory Visit (INDEPENDENT_AMBULATORY_CARE_PROVIDER_SITE_OTHER): Payer: PRIVATE HEALTH INSURANCE | Admitting: Family Medicine

## 2019-01-18 VITALS — Temp 96.7°F | Wt 156.0 lb

## 2019-01-18 DIAGNOSIS — J029 Acute pharyngitis, unspecified: Secondary | ICD-10-CM

## 2019-01-18 DIAGNOSIS — H6983 Other specified disorders of Eustachian tube, bilateral: Secondary | ICD-10-CM

## 2019-01-18 MED ORDER — FLUTICASONE PROPIONATE 50 MCG/ACT NA SUSP: 2.0000 | Freq: Every day | NASAL | 6 refills | Status: AC

## 2019-01-18 NOTE — Progress Notes (Signed)
Virtual Visit via Video Note  I connected with Janice Greene on 01/18/19 at  2:00 PM EDT by a video enabled telemedicine application and verified that I am speaking with the correct person using two identifiers.  Location: Patient: In her home Provider: [place]   I discussed the limitations of evaluation and management by telemedicine and the availability of in person appointments. The patient expressed understanding and agreed to proceed.  History of Present Illness: This is a 53 yo female who requests virtual visit today for off and on sore throat x 3 weeks.  Seems to be worse when she wakes up and she is not sure if she is having postnasal drainage.  She is having some occasional right ear pain that seems to radiate to her throat but it is "not terrible."  The left ear has been occasionally popping and cracking with swallowing.  Last week she had some sinus headache which was relieved with her migraine treatment medications.  She denies fever or cough.  She reports that she feels fine. She reports her blood sugars are generally in the 1 20-1 57 range and that she has not had any readings over 200. Past Medical History:  Diagnosis Date  . Anxiety   . Borderline diabetes   . Diabetes mellitus without complication ([place])   . Headache(784.0)   . History of blood transfusion    after childbirth  . Hyperlipidemia   . Palpitations    Past Surgical History:  Procedure Laterality Date  . AUGMENTATION MAMMAPLASTY Bilateral   . CHOLECYSTECTOMY  1989  . TUBAL LIGATION  1992   Family History  Problem Relation Age of Onset  . Colon cancer Brother 42  . Heart attack Brother   . Arrhythmia Brother   . Heart disease Father   . Heart attack Father   . Heart disease Mother   . Arrhythmia Sister    Social History   Tobacco Use  . Smoking status: Former Smoker    Packs/day: 0.25    Years: 10.00    Pack years: 2.50    Types: Cigarettes  . Smokeless tobacco: Never Used  .  Tobacco comment: Previous social smoker  Substance Use Topics  . Alcohol use: No    Alcohol/week: 0.0 standard drinks  . Drug use: No      Observations/Objective: The patient is alert and answers questions appropriately.  Visible skin is unremarkable.  She is normally conversive without shortness of breath, audible wheeze or witnessed cough.  Mood and affect are appropriate. Temp (!) 96.7 F (35.9 C) Comment: per patient  Wt 156 lb (70.8 kg) Comment: per patient  BMI 27.20 kg/m  Wt Readings from Last 3 Encounters:  01/18/19 156 lb (70.8 kg)  10/19/18 156 lb (70.8 kg)  04/25/18 157 lb (71.2 kg)     Assessment and Plan: 1. Sore throat -Likely due to postnasal drainage/allergic rhinitis -We will start her on fluticasone and she is to follow-up if symptoms not improved within 2 weeks - fluticasone (FLONASE) 50 MCG/ACT nasal spray; Place 2 sprays into both nostrils daily.  Dispense: 16 g; Refill: 6  2. Dysfunction of both eustachian tubes - fluticasone (FLONASE) 50 MCG/ACT nasal spray; Place 2 sprays into both nostrils daily.  Dispense: 16 g; Refill: [place], FNP-BC  [facility] [place]  01/18/2019 2:27 PM   Follow Up Instructions: Visit recap sent to patient via my chart.   I  discussed the assessment and treatment plan with the patient. The patient was provided an opportunity to ask questions and all were answered. The patient agreed with the plan and demonstrated an understanding of the instructions.   The patient was advised to call back or seek an in-person evaluation if the symptoms worsen or if the condition fails to improve as anticipated.    Emi Belfasteborah B Michiko Lineman, FNP

## 2019-01-19 ENCOUNTER — Other Ambulatory Visit: Payer: Self-pay | Admitting: Family Medicine

## 2019-01-21 NOTE — Telephone Encounter (Signed)
Imitrex Last filled:  12/12/18, #9 Last OV:  01/18/19, acute Next OV:  none

## 2019-01-28 ENCOUNTER — Encounter: Payer: Self-pay | Admitting: Family Medicine

## 2019-02-14 ENCOUNTER — Encounter: Payer: Self-pay | Admitting: Family Medicine

## 2019-02-18 ENCOUNTER — Other Ambulatory Visit: Payer: Self-pay | Admitting: Family Medicine

## 2019-02-18 NOTE — Telephone Encounter (Signed)
Electronic refill request. Sumatriptan Last office visit:   01/18/2019 Acute Last Filled:    9 tablet 0 01/21/2019  Please advise.

## 2019-02-27 ENCOUNTER — Other Ambulatory Visit: Payer: Self-pay | Admitting: Family Medicine

## 2019-02-28 ENCOUNTER — Other Ambulatory Visit: Payer: Self-pay | Admitting: Family Medicine

## 2019-03-01 NOTE — Telephone Encounter (Signed)
Gabapentin Last filled:  01/29/19, #90 Last OV:  01/18/19, acute Next OV:  none

## 2019-03-20 ENCOUNTER — Other Ambulatory Visit: Payer: Self-pay | Admitting: Family Medicine

## 2019-03-21 NOTE — Telephone Encounter (Signed)
Last filled on 02/18/2019 #9 with 0 refill. LOV 01/18/2019 for acute visit. No future appointments made.

## 2019-04-09 LAB — HM DIABETES EYE EXAM

## 2019-04-15 ENCOUNTER — Encounter: Payer: Self-pay | Admitting: Family Medicine

## 2019-05-06 ENCOUNTER — Ambulatory Visit: Payer: PRIVATE HEALTH INSURANCE | Admitting: Family Medicine

## 2019-05-06 ENCOUNTER — Other Ambulatory Visit: Payer: Self-pay

## 2019-05-06 ENCOUNTER — Encounter: Payer: Self-pay | Admitting: Family Medicine

## 2019-05-06 VITALS — BP 122/72 | HR 77 | Temp 97.7°F | Ht 63.5 in | Wt 163.0 lb

## 2019-05-06 DIAGNOSIS — M25551 Pain in right hip: Secondary | ICD-10-CM | POA: Diagnosis not present

## 2019-05-06 DIAGNOSIS — Z23 Encounter for immunization: Secondary | ICD-10-CM | POA: Diagnosis not present

## 2019-05-06 DIAGNOSIS — E039 Hypothyroidism, unspecified: Secondary | ICD-10-CM | POA: Diagnosis not present

## 2019-05-06 DIAGNOSIS — E1165 Type 2 diabetes mellitus with hyperglycemia: Secondary | ICD-10-CM

## 2019-05-06 DIAGNOSIS — G8929 Other chronic pain: Secondary | ICD-10-CM | POA: Diagnosis not present

## 2019-05-06 LAB — POCT GLYCOSYLATED HEMOGLOBIN (HGB A1C): Hemoglobin A1C: 8 % — AB (ref 4.0–5.6)

## 2019-05-06 LAB — TSH: TSH: 3.4 u[IU]/mL (ref 0.35–4.50)

## 2019-05-06 NOTE — Addendum Note (Signed)
Addended by: Kris Mouton on: 05/06/2019 08:42 AM   Modules accepted: Orders

## 2019-05-06 NOTE — Progress Notes (Signed)
Subjective:    Patient ID: Janice Greene, female    DOB: 01-27-1966, 53 y.o.   MRN: [id number]  HPI This is a 53 yo female who presents today for follow up of diabetes mellitus, hypothyroidism.  Blood sugars running under 200. Taking 24 units of insulin at night. Occasional lower readings in the 70s. Has been more active with taking on new job responsibilities.  Meals- breakfast, rarely eats, sometimes eats crackers. Lunch- salad, wraps, burger. Dinner- cooks at home, meat, vegetables, rice, potato.   Hips and low back sore in am. Better once she starts moving around. Her son in law is a PTA and when he stretches her, she feels better. Not doing any stretching now. No falls or weakness.    Past Medical History:  Diagnosis Date  . Anxiety   . Borderline diabetes   . Diabetes mellitus without complication ([place])   . Headache(784.0)   . History of blood transfusion    after childbirth  . Hyperlipidemia   . Palpitations    Past Surgical History:  Procedure Laterality Date  . AUGMENTATION MAMMAPLASTY Bilateral   . CHOLECYSTECTOMY  1989  . TUBAL LIGATION  1992   Family History  Problem Relation Age of Onset  . Colon cancer Brother 10  . Heart attack Brother   . Arrhythmia Brother   . Heart disease Father   . Heart attack Father   . Heart disease Mother   . Arrhythmia Sister    Social History   Tobacco Use  . Smoking status: Former Smoker    Packs/day: 0.25    Years: 10.00    Pack years: 2.50    Types: Cigarettes  . Smokeless tobacco: Never Used  . Tobacco comment: Previous social smoker  Substance Use Topics  . Alcohol use: No    Alcohol/week: 0.0 standard drinks  . Drug use: No      Review of Systems Per HPI    Objective:   Physical Exam Vitals signs reviewed.  Constitutional:      General: She is not in acute distress.    Appearance: Normal appearance. She is normal weight. She is not ill-appearing, toxic-appearing or diaphoretic.  HENT:     Head:  Normocephalic and atraumatic.  Eyes:     Conjunctiva/sclera: Conjunctivae normal.  Cardiovascular:     Rate and Rhythm: Normal rate.  Pulmonary:     Effort: Pulmonary effort is normal.  Neurological:     Mental Status: She is alert and oriented to person, place, and time.  Psychiatric:        Mood and Affect: Mood normal.        Behavior: Behavior normal.        Thought Content: Thought content normal.        Judgment: Judgment normal.      BP 122/72   Pulse 77   Temp 97.7 F (36.5 C)   Ht 5' 3.5" (1.613 m)   Wt 163 lb (73.9 kg)   SpO2 96%   BMI 28.42 kg/m   Wt Readings from Last 3 Encounters:  05/06/19 163 lb (73.9 kg)  01/18/19 156 lb (70.8 kg)  10/19/18 156 lb (70.8 kg)    Results for orders placed or performed in visit on 05/06/19  HgB A1c  Result Value Ref Range   Hemoglobin A1C 8.0 (A) 4.0 - 5.6 %   HbA1c POC (<> result, manual entry)     HbA1c, POC (prediabetic range)     HbA1c, POC (  controlled diabetic range)         Assessment & Plan:  1. Uncontrolled type 2 diabetes mellitus with hyperglycemia (HCC) - improved from 8.9 to 8.0 - provided her additional information for dietary changes and encouraged her to walk 30 minutes daily - HgB A1c  2. Acquired hypothyroidism - TSH  3. Chronic right hip pain - provided written exercises - suggested PT if no improvement  - follow up in 3 months for CPE   Olean Ree, FNP-BC   Primary [facility] at [facility], [facility]  05/06/2019 8:32 AM

## 2019-05-06 NOTE — Patient Instructions (Addendum)
Follow up in 3 months  Hip Exercises Ask your health care provider which exercises are safe for you. Do exercises exactly as told by your health care provider and adjust them as directed. It is normal to feel mild stretching, pulling, tightness, or discomfort as you do these exercises. Stop right away if you feel sudden pain or your pain gets worse. Do not begin these exercises until told by your health care provider. Stretching and range-of-motion exercises These exercises warm up your muscles and joints and improve the movement and flexibility of your hip. These exercises also help to relieve pain, numbness, and tingling. You may be asked to limit your range of motion if you had a hip replacement. Talk to your health care provider about these restrictions. Hamstrings, supine  1. Lie on your back (supine position). 2. Loop a belt or towel over the ball of your left / right foot. The ball of your foot is on the walking surface, right under your toes. 3. Straighten your left / right knee and slowly pull on the belt or towel to raise your leg until you feel a gentle stretch behind your knee (hamstring). ? Do not let your knee bend while you do this. ? Keep your other leg flat on the floor. 4. Hold this position for __________ seconds. 5. Slowly return your leg to the starting position. Repeat __________ times. Complete this exercise __________ times a day. Hip rotation  1. Lie on your back on a firm surface. 2. With your left / right hand, gently pull your left / right knee toward the shoulder that is on the same side of the body. Stop when your knee is pointing toward the ceiling. 3. Hold your left / right ankle with your other hand. 4. Keeping your knee steady, gently pull your left / right ankle toward your other shoulder until you feel a stretch in your buttocks. ? Keep your hips and shoulders firmly planted while you do this stretch. 5. Hold this position for __________ seconds. Repeat  __________ times. Complete this exercise __________ times a day. Seated stretch This exercise is sometimes called hamstrings and adductors stretch. 1. Sit on the floor with your legs stretched wide. Keep your knees straight during this exercise. 2. Keeping your head and back in a straight line, bend at your waist to reach for your left foot (position A). You should feel a stretch in your right inner thigh (adductors). 3. Hold this position for __________ seconds. Then slowly return to the upright position. 4. Keeping your head and back in a straight line, bend at your waist to reach forward (position B). You should feel a stretch behind both of your thighs and knees (hamstrings). 5. Hold this position for __________ seconds. Then slowly return to the upright position. 6. Keeping your head and back in a straight line, bend at your waist to reach for your right foot (position C). You should feel a stretch in your left inner thigh (adductors). 7. Hold this position for __________ seconds. Then slowly return to the upright position. Repeat __________ times. Complete this exercise __________ times a day. Lunge This exercise stretches the muscles of the hip (hip flexors). 1. Place your left / right knee on the floor and bend your other knee so that is directly over your ankle. You should be half-kneeling. 2. Keep good posture with your head over your shoulders. 3. Tighten your buttocks to point your tailbone downward. This will prevent your back from arching too  much. 4. You should feel a gentle stretch in the front of your left / right thigh and hip. If you do not feel a stretch, slide your other foot forward slightly and then slowly lunge forward with your chest up until your knee once again lines up over your ankle. ? Make sure your tailbone continues to point downward. 5. Hold this position for __________ seconds. 6. Slowly return to the starting position. Repeat __________ times. Complete this  exercise __________ times a day. Strengthening exercises These exercises build strength and endurance in your hip. Endurance is the ability to use your muscles for a long time, even after they get tired. Bridge This exercise strengthens the muscles of your hip (hip extensors). 1. Lie on your back on a firm surface with your knees bent and your feet flat on the floor. 2. Tighten your buttocks muscles and lift your bottom off the floor until the trunk of your body and your hips are level with your thighs. ? Do not arch your back. ? You should feel the muscles working in your buttocks and the back of your thighs. If you do not feel these muscles, slide your feet 1-2 inches (2.5-5 cm) farther away from your buttocks. 3. Hold this position for __________ seconds. 4. Slowly lower your hips to the starting position. 5. Let your muscles relax completely between repetitions. Repeat __________ times. Complete this exercise __________ times a day. Straight leg raises, side-lying This exercise strengthens the muscles that move the hip joint away from the center of the body (hip abductors). 1. Lie on your side with your left / right leg in the top position. Lie so your head, shoulder, hip, and knee line up. You may bend your bottom knee slightly to help you balance. 2. Roll your hips slightly forward, so your hips are stacked directly over each other and your left / right knee is facing forward. 3. Leading with your heel, lift your top leg 4-6 inches (10-15 cm). You should feel the muscles in your top hip lifting. ? Do not let your foot drift forward. ? Do not let your knee roll toward the ceiling. 4. Hold this position for __________ seconds. 5. Slowly return to the starting position. 6. Let your muscles relax completely between repetitions. Repeat __________ times. Complete this exercise __________ times a day. Straight leg raises, side-lying This exercise strengthen the muscles that move the hip joint  toward the center of the body (hip adductors). 1. Lie on your side with your left / right leg in the bottom position. Lie so your head, shoulder, hip, and knee line up. You may place your upper foot in front to help you balance. 2. Roll your hips slightly forward, so your hips are stacked directly over each other and your left / right knee is facing forward. 3. Tense the muscles in your inner thigh and lift your bottom leg 4-6 inches (10-15 cm). 4. Hold this position for __________ seconds. 5. Slowly return to the starting position. 6. Let your muscles relax completely between repetitions. Repeat __________ times. Complete this exercise __________ times a day. Straight leg raises, supine This exercise strengthens the muscles in the front of your thigh (quadriceps). 1. Lie on your back (supine position) with your left / right leg extended and your other knee bent. 2. Tense the muscles in the front of your left / right thigh. You should see your kneecap slide up or see increased dimpling just above your knee. 3. Keep these  muscles tight as you raise your leg 4-6 inches (10-15 cm) off the floor. Do not let your knee bend. 4. Hold this position for __________ seconds. 5. Keep these muscles tense as you lower your leg. 6. Relax the muscles slowly and completely between repetitions. Repeat __________ times. Complete this exercise __________ times a day. Hip abductors, standing This exercise strengthens the muscles that move the leg and hip joint away from the center of the body (hip abductors). 1. Tie one end of a rubber exercise band or tubing to a secure surface, such as a chair, table, or pole. 2. Loop the other end of the band or tubing around your left / right ankle. 3. Keeping your ankle with the band or tubing directly opposite the secured end, step away until there is tension in the tubing or band. Hold on to a chair, table, or pole as needed for balance. 4. Lift your left / right leg out to  your side. While you do this: ? Keep your back upright. ? Keep your shoulders over your hips. ? Keep your toes pointing forward. ? Make sure to use your hip muscles to slowly lift your leg. Do not tip your body or forcefully lift your leg. 5. Hold this position for __________ seconds. 6. Slowly return to the starting position. Repeat __________ times. Complete this exercise __________ times a day. Squats This exercise strengthens the muscles in the front of your thigh (quadriceps). 1. Stand in a door frame so your feet and knees are in line with the frame. You may place your hands on the frame for balance. 2. Slowly bend your knees and lower your hips like you are going to sit in a chair. ? Keep your lower legs in a straight-up-and-down position. ? Do not let your hips go lower than your knees. ? Do not bend your knees lower than told by your health care provider. ? If your hip pain increases, do not bend as low. 3. Hold this position for ___________ seconds. 4. Slowly push with your legs to return to standing. Do not use your hands to pull yourself to standing. Repeat __________ times. Complete this exercise __________ times a day. This information is not intended to replace advice given to you by your health care provider. Make sure you discuss any questions you have with your health care provider. Document Released: 07/08/2005 Document Revised: 05/01/2018 Document Reviewed: 05/01/2018 Elsevier Patient Education  2020 [employer].   A resource that I like is [employer], operational.dietdoctor.com/diabetes/diet  I recommend you check your blood sugar daily and keep a log.  Very the time you check your blood sugar such as fasting, before meal, 2 hours after a meal and at bedtime.  Look for trends with the foods you are eating and be a scientist of your body.  Here are some guidelines to help you with meal planning -  Avoid all processed and packaged foods (bread, pasta, crackers, chips, etc) and  beverages containing calories.  Avoid added sugars and excessive natural sugars.  Attention to how you feel if you consume artificial sweeteners.  Do they make you more hungry or raise your blood sugar?  With every meal and snack, aim to get 20 g of protein (3 ounces of meat, 4 ounces of fish, 3 eggs, protein powder, 1 cup [place] yogurt, 1 cup cottage cheese, etc.)  Increase fiber in the form of non-starchy vegetables.  These help you feel full with very little carbohydrates and are good for gut  health.  Eat 1 serving healthy carb per meal- 1/2 cup brown rice, beans, potato, corn- pay attention to whether or not this significantly raises your blood sugar. If it does, reduce the frequency you consume these.   Have small amounts of good fats such as avocado, nuts, olive oil, nut butters, olives.  Add a little cheese to your salads to make them tasty.

## 2019-05-16 ENCOUNTER — Encounter: Payer: Self-pay | Admitting: Family Medicine

## 2019-05-16 ENCOUNTER — Other Ambulatory Visit: Payer: Self-pay | Admitting: Family Medicine

## 2019-05-16 NOTE — Telephone Encounter (Signed)
Do not see any prior refill requests since September. Please review.  Last filled on 03/21/2019 #9 with 1 refill LOV 05/06/2019  No future appointments.

## 2019-05-16 NOTE — Telephone Encounter (Signed)
Patient is requesting a refill  Patient has reached out to the pharmacy twice for refill request  SUMAtriptan  [facility]- [address]- [place]

## 2019-05-17 ENCOUNTER — Other Ambulatory Visit: Payer: Self-pay | Admitting: Family Medicine

## 2019-05-17 MED ORDER — SUMATRIPTAN SUCCINATE 100 MG PO TABS: ORAL_TABLET | ORAL | 1 refills | Status: DC

## 2019-05-17 NOTE — Telephone Encounter (Signed)
Please call patient and let her know that I have sent in a refill and I am sorry that we didn't get previous requests from her pharmacy. Please remind her to follow up in February. Can go ahead and schedule if she is willing.

## 2019-05-17 NOTE — Telephone Encounter (Signed)
Noted! Thank you

## 2019-05-17 NOTE — Telephone Encounter (Signed)
Patient contacted office and was notified about rx being sent into the pharmacy. Patient has also been scheduled for CPE in February as requested. Thanks!

## 2019-05-20 ENCOUNTER — Encounter: Payer: Self-pay | Admitting: Family Medicine

## 2019-06-14 ENCOUNTER — Other Ambulatory Visit: Payer: Self-pay | Admitting: *Deleted

## 2019-06-14 DIAGNOSIS — G43909 Migraine, unspecified, not intractable, without status migrainosus: Secondary | ICD-10-CM

## 2019-06-14 MED ORDER — AMITRIPTYLINE HCL 10 MG PO TABS: 10.0000 mg | ORAL_TABLET | Freq: Every day | ORAL | 0 refills | Status: DC

## 2019-07-11 ENCOUNTER — Telehealth: Payer: Self-pay | Admitting: Family Medicine

## 2019-07-11 NOTE — Telephone Encounter (Signed)
Pt called checking on rx refill she stated this was called in 1/5 sumatripean  [facility] [address]  Pt has 1 pill left

## 2019-07-11 NOTE — Telephone Encounter (Signed)
Pt calling requesting a refill of her Sumatriptan  Last Refill 05/17/2019 #9 x 1rf Last OV 05/06/2019  Called and left a voicemail for patient making her aware that I received her message and was sending this to Eunice Blase to get approved. I also made her aware that we have not yet received a request from her pharmacy.   Please advise, thanks.

## 2019-07-12 ENCOUNTER — Other Ambulatory Visit: Payer: Self-pay | Admitting: Family Medicine

## 2019-07-12 MED ORDER — SUMATRIPTAN SUCCINATE 100 MG PO TABS: ORAL_TABLET | ORAL | 1 refills | Status: DC

## 2019-07-12 NOTE — Telephone Encounter (Signed)
Please call patient and let her know that I have refilled her sumatriptan. Sorry for any delay, I did not see a request come through from her pharmacy.

## 2019-07-12 NOTE — Telephone Encounter (Signed)
Left a detailed message on patient machine making her aware that we have refilled her medication. Advised to call back if any questions.

## 2019-07-22 ENCOUNTER — Other Ambulatory Visit: Payer: Self-pay | Admitting: Family Medicine

## 2019-07-22 DIAGNOSIS — E7800 Pure hypercholesterolemia, unspecified: Secondary | ICD-10-CM

## 2019-07-22 DIAGNOSIS — E1165 Type 2 diabetes mellitus with hyperglycemia: Secondary | ICD-10-CM

## 2019-07-29 ENCOUNTER — Other Ambulatory Visit: Payer: PRIVATE HEALTH INSURANCE

## 2019-07-30 ENCOUNTER — Telehealth: Payer: PRIVATE HEALTH INSURANCE | Admitting: Nurse Practitioner

## 2019-07-30 DIAGNOSIS — J020 Streptococcal pharyngitis: Secondary | ICD-10-CM | POA: Diagnosis not present

## 2019-07-30 MED ORDER — AMOXICILLIN 500 MG PO CAPS: 500.0000 mg | ORAL_CAPSULE | Freq: Two times a day (BID) | ORAL | 0 refills | Status: DC

## 2019-07-30 NOTE — Progress Notes (Signed)

## 2019-08-02 ENCOUNTER — Encounter: Payer: Self-pay | Admitting: Family Medicine

## 2019-08-02 MED ORDER — GLYBURIDE-METFORMIN 5-500 MG PO TABS: ORAL_TABLET | ORAL | 0 refills | Status: DC

## 2019-08-05 ENCOUNTER — Encounter: Payer: PRIVATE HEALTH INSURANCE | Admitting: Family Medicine

## 2019-08-07 ENCOUNTER — Other Ambulatory Visit: Payer: Self-pay

## 2019-08-07 MED ORDER — FENOFIBRATE 54 MG PO TABS: ORAL_TABLET | ORAL | 0 refills | Status: DC

## 2019-08-09 ENCOUNTER — Encounter: Payer: Self-pay | Admitting: Family Medicine

## 2019-08-09 MED ORDER — ROSUVASTATIN CALCIUM 10 MG PO TABS: 10.0000 mg | ORAL_TABLET | Freq: Every day | ORAL | 0 refills | Status: DC

## 2019-09-10 ENCOUNTER — Encounter: Payer: Self-pay | Admitting: Family Medicine

## 2019-09-11 ENCOUNTER — Encounter: Payer: Self-pay | Admitting: Family Medicine

## 2019-09-11 MED ORDER — SUMATRIPTAN SUCCINATE 100 MG PO TABS: ORAL_TABLET | ORAL | 1 refills | Status: DC

## 2019-09-11 NOTE — Telephone Encounter (Signed)
Last OV 05/06/2019, advised to follow up with CPE Instructions Return in about 3 months (around 08/06/2019) for cpe. Last refilled 07/12/19 #9 x 1 refills.   Please advise, thanks.

## 2019-09-11 NOTE — Telephone Encounter (Signed)
Pt called to check status on this. I advised her that it can take 48-72 hours for prescription refills. She states she is completely out of this medication.

## 2019-09-27 ENCOUNTER — Other Ambulatory Visit: Payer: Self-pay

## 2019-09-27 ENCOUNTER — Other Ambulatory Visit (INDEPENDENT_AMBULATORY_CARE_PROVIDER_SITE_OTHER): Payer: PRIVATE HEALTH INSURANCE

## 2019-09-27 DIAGNOSIS — E7800 Pure hypercholesterolemia, unspecified: Secondary | ICD-10-CM | POA: Diagnosis not present

## 2019-09-27 DIAGNOSIS — E1165 Type 2 diabetes mellitus with hyperglycemia: Secondary | ICD-10-CM | POA: Diagnosis not present

## 2019-09-27 LAB — COMPREHENSIVE METABOLIC PANEL
ALT: 27 U/L (ref 0–35)
AST: 27 U/L (ref 0–37)
Albumin: 4.6 g/dL (ref 3.5–5.2)
Alkaline Phosphatase: 67 U/L (ref 39–117)
BUN: 17 mg/dL (ref 6–23)
CO2: 28 mEq/L (ref 19–32)
Calcium: 9.9 mg/dL (ref 8.4–10.5)
Chloride: 102 mEq/L (ref 96–112)
Creatinine, Ser: 0.99 mg/dL (ref 0.40–1.20)
GFR: 58.08 mL/min — ABNORMAL LOW (ref >60.00)
Glucose, Bld: 177 mg/dL — ABNORMAL HIGH (ref 70–99)
Potassium: 4.4 mEq/L (ref 3.5–5.1)
Sodium: 139 mEq/L (ref 135–145)
Total Bilirubin: 0.4 mg/dL (ref 0.2–1.2)
Total Protein: 7.5 g/dL (ref 6.0–8.3)

## 2019-09-27 LAB — VITAMIN D 25 HYDROXY (VIT D DEFICIENCY, FRACTURES): VITD: 18.02 ng/mL — ABNORMAL LOW (ref 30.00–100.00)

## 2019-09-27 LAB — LIPID PANEL
Cholesterol: 138 mg/dL (ref 0–200)
HDL: 39.1 mg/dL (ref >39.00)
LDL Cholesterol: 64 mg/dL (ref 0–99)
NonHDL: 98.46
Total CHOL/HDL Ratio: 4
Triglycerides: 171 mg/dL — ABNORMAL HIGH (ref 0.0–149.0)
VLDL: 34.2 mg/dL (ref 0.0–40.0)

## 2019-09-27 LAB — HEMOGLOBIN A1C: Hgb A1c MFr Bld: 8.5 % — ABNORMAL HIGH (ref 4.6–6.5)

## 2019-09-27 LAB — VITAMIN B12: Vitamin B-12: 181 pg/mL — ABNORMAL LOW (ref 211–911)

## 2019-10-02 ENCOUNTER — Ambulatory Visit (INDEPENDENT_AMBULATORY_CARE_PROVIDER_SITE_OTHER): Payer: PRIVATE HEALTH INSURANCE | Admitting: Family Medicine

## 2019-10-02 ENCOUNTER — Encounter: Payer: Self-pay | Admitting: Family Medicine

## 2019-10-02 ENCOUNTER — Other Ambulatory Visit: Payer: Self-pay

## 2019-10-02 VITALS — BP 118/70 | HR 78 | Temp 98.6°F | Ht 63.5 in | Wt 159.2 lb

## 2019-10-02 DIAGNOSIS — R29898 Other symptoms and signs involving the musculoskeletal system: Secondary | ICD-10-CM

## 2019-10-02 DIAGNOSIS — M25551 Pain in right hip: Secondary | ICD-10-CM

## 2019-10-02 DIAGNOSIS — Z0000 Encounter for general adult medical examination without abnormal findings: Secondary | ICD-10-CM

## 2019-10-02 DIAGNOSIS — E538 Deficiency of other specified B group vitamins: Secondary | ICD-10-CM

## 2019-10-02 DIAGNOSIS — E1165 Type 2 diabetes mellitus with hyperglycemia: Secondary | ICD-10-CM

## 2019-10-02 DIAGNOSIS — G8929 Other chronic pain: Secondary | ICD-10-CM

## 2019-10-02 DIAGNOSIS — E559 Vitamin D deficiency, unspecified: Secondary | ICD-10-CM | POA: Diagnosis not present

## 2019-10-02 MED ORDER — "TUBERCULIN SYRINGE 26G X 3/8"" 1 ML MISC": 1.0000 [IU] | 4 refills | Status: DC | PRN

## 2019-10-02 MED ORDER — CYANOCOBALAMIN 1000 MCG/ML IJ SOLN: INTRAMUSCULAR | 4 refills | Status: AC

## 2019-10-02 MED ORDER — VITAMIN D3 1.25 MG (50000 UT) PO TABS: 1.0000 | ORAL_TABLET | ORAL | 3 refills | Status: AC

## 2019-10-02 NOTE — Progress Notes (Signed)
Subjective:    Patient ID: Janice Greene, female    DOB: Jun 30, 1966, 54 y.o.   MRN: [id number]  HPI Chief Complaint  Patient presents with  . Annual Exam   This is a 54 yo female who presents today for CPE  Last CPE- 10/23-2019 Mammo- 09/01/2017 Pap- 03/21/2017 Colonoscopy- 05/20/2015 Tdap-05/08/2017 Flu- annual Eye- 04/09/2019 Dental- regular Exercise- walking  Leg fatigue- all the time, both, right hip pain across buttock. Legs feel weak/ worse if blood sugar is low (60). Some improvement with ibuprofen. Up and down at work.   DM type 2- takes meds with lunch, sometimes has low blood sugars to 60. Hgba1c elevated with last blood draw, 8.5, up from 8.0. Checks blood sugars in afternoon, always under 200. Doesn't always eat breakfast, lunch- eats at work, salads, wraps. Dinner- meat, vegetable, starch.   Headaches- a couple of times a week. Not taking amitriptyline, or gabapentin. Good relief with sumatriptan 1/2 tablet. Some start with right sided neck pain.   Review of Systems  Constitutional: Negative.   HENT: Negative.   Eyes: Negative.   Respiratory: Negative.   Cardiovascular: Negative.   Endocrine: Negative.   Genitourinary: Negative.   Musculoskeletal:       Hip pain, see HPI  Skin: Negative.   Allergic/Immunologic: Negative.   Neurological: Positive for headaches (chronic, see HPI).  Psychiatric/Behavioral: Negative.        Objective:   Physical Exam Vitals reviewed.  Constitutional:      General: She is not in acute distress.    Appearance: Normal appearance. She is normal weight. She is not ill-appearing, toxic-appearing or diaphoretic.  HENT:     Head: Normocephalic and atraumatic.     Right Ear: External ear normal.     Left Ear: External ear normal.  Cardiovascular:     Rate and Rhythm: Normal rate and regular rhythm.     Pulses: Normal pulses.     Heart sounds: Normal heart sounds.  Pulmonary:     Effort: Pulmonary effort is normal.     Breath  sounds: Normal breath sounds.  Chest:     Breasts:        Right: No swelling, bleeding, inverted nipple, mass, nipple discharge, skin change or tenderness.        Left: No swelling, bleeding, inverted nipple, mass, nipple discharge, skin change or tenderness.     Comments: Bilateral breast implants.  Abdominal:     General: Abdomen is flat. Bowel sounds are normal. There is no distension.     Palpations: Abdomen is soft. There is no mass.     Tenderness: There is no abdominal tenderness. There is no guarding or rebound.     Hernia: No hernia is present.  Musculoskeletal:     Cervical back: Normal range of motion and neck supple.     Right hip: No bony tenderness. Decreased range of motion (internal/ external rotation). Normal strength.     Left hip: Normal.     Right lower leg: No edema.     Left lower leg: No edema.  Lymphadenopathy:     Upper Body:     Right upper body: No supraclavicular, axillary or pectoral adenopathy.     Left upper body: No supraclavicular, axillary or pectoral adenopathy.  Neurological:     Mental Status: She is alert.       BP 118/70 (BP Location: Left Arm, Patient Position: Sitting, Cuff Size: Normal)   Pulse 78   Temp 98.6 F (  37 C) (Temporal)   Ht 5' 3.5" (1.613 m)   Wt 159 lb 3.2 oz (72.2 kg)   BMI 27.76 kg/m  Wt Readings from Last 3 Encounters:  10/02/19 159 lb 3.2 oz (72.2 kg)  05/06/19 163 lb (73.9 kg)  01/18/19 156 lb (70.8 kg)   Diabetic Foot Exam - Simple   Simple Foot Form Diabetic Foot exam was performed with the following findings: Yes 10/02/2019  9:15 AM  Visual Inspection See comments: Yes Sensation Testing Intact to touch and monofilament testing bilaterally: Yes Pulse Check Posterior Tibialis and Dorsalis pulse intact bilaterally: Yes Comments Cracking of heels.         Assessment & Plan:  1. Annual physical exam - Discussed and encouraged healthy lifestyle choices- adequate sleep, regular exercise, stress management  and healthy food choices.  -Overdue for mammogram, she was given information to call and schedule and encouraged to do so  2. Vitamin B12 deficiency - patient will administer at home - cyanocobalamin (,VITAMIN B-12,) 1000 MCG/ML injection; Give 1000 mcg every 7 days x 4 doses, then once a month  Dispense: 12 mL; Refill: 4 - TUBERCULIN SYR 1CC/26GX3/8" 26G X 3/8" 1 ML MISC; 1 Units by Does not apply route as needed.  Dispense: 12 each; Refill: 4 - recheck in 6 months  3. Vitamin D deficiency - very low, discussed supplementation  - Cholecalciferol (VITAMIN D3) 1.25 MG (50000 UT) TABS; Take 1 tablet by mouth every 7 (seven) days.  Dispense: 12 tablet; Refill: 3  4. Uncontrolled type 2 diabetes mellitus with hyperglycemia (HCC) -Discussed her diet and encouraged her to decrease her carbohydrates, check her blood sugar more frequently and keep a log. -Increase insulin to 26 units nightly this week and then 28 units next week -She is going to check her insurance coverage and see if there is a less expensive long-acting insulin on her plan.  She will let me know either way so I can send in a new prescription at the higher dose. -LDL at goal on rosuvastatin 10 mg  5. Chronic right hip pain -No worrisome findings on history or physical exam.  She is very tight with internal and external rotation.  I have given her some exercises and her son-in-law who lives across the street is a physical therapy assistant and has offered to help her with exercises as well. -If no improvement consider referral to physical therapy and/or orthopedics/sports medicine  6. Leg fatigue -We will start with conservative measures, over-the-counter mild compression socks -We will see if supplementing vitamin D and B12 help with this as well  -Follow-up diabetes in 3 months  This visit occurred during the SARS-CoV-2 public health emergency.  Safety protocols were in place, including screening questions prior to the visit,  additional usage of staff PPE, and extensive cleaning of exam room while observing appropriate contact time as indicated for disinfecting solutions.      Olean Ree, FNP-BC  [facility], [facility]  10/05/2019 10:25 AM

## 2019-10-02 NOTE — Patient Instructions (Signed)
Keep log of blood sugars- send to me via mychart in 4 weeks Increase insulin to 26 units this week, 28 next week Follow up in office in 3 months B12- once a week x 4, then every month- recheck in 6 months Vit D3- 50,000 IU weekly- recheck in 6 months Compression socks  Hip Exercises Ask your health care provider which exercises are safe for you. Do exercises exactly as told by your health care provider and adjust them as directed. It is normal to feel mild stretching, pulling, tightness, or discomfort as you do these exercises. Stop right away if you feel sudden pain or your pain gets worse. Do not begin these exercises until told by your health care provider. Stretching and range-of-motion exercises These exercises warm up your muscles and joints and improve the movement and flexibility of your hip. These exercises also help to relieve pain, numbness, and tingling. You may be asked to limit your range of motion if you had a hip replacement. Talk to your health care provider about these restrictions. Hamstrings, supine  1. Lie on your back (supine position). 2. Loop a belt or towel over the ball of your left / right foot. The ball of your foot is on the walking surface, right under your toes. 3. Straighten your left / right knee and slowly pull on the belt or towel to raise your leg until you feel a gentle stretch behind your knee (hamstring). ? Do not let your knee bend while you do this. ? Keep your other leg flat on the floor. 4. Hold this position for __________ seconds. 5. Slowly return your leg to the starting position. Repeat __________ times. Complete this exercise __________ times a day. Hip rotation  1. Lie on your back on a firm surface. 2. With your left / right hand, gently pull your left / right knee toward the shoulder that is on the same side of the body. Stop when your knee is pointing toward the ceiling. 3. Hold your left / right ankle with your other hand. 4. Keeping your  knee steady, gently pull your left / right ankle toward your other shoulder until you feel a stretch in your buttocks. ? Keep your hips and shoulders firmly planted while you do this stretch. 5. Hold this position for __________ seconds. Repeat __________ times. Complete this exercise __________ times a day. Seated stretch This exercise is sometimes called hamstrings and adductors stretch. 1. Sit on the floor with your legs stretched wide. Keep your knees straight during this exercise. 2. Keeping your head and back in a straight line, bend at your waist to reach for your left foot (position A). You should feel a stretch in your right inner thigh (adductors). 3. Hold this position for __________ seconds. Then slowly return to the upright position. 4. Keeping your head and back in a straight line, bend at your waist to reach forward (position B). You should feel a stretch behind both of your thighs and knees (hamstrings). 5. Hold this position for __________ seconds. Then slowly return to the upright position. 6. Keeping your head and back in a straight line, bend at your waist to reach for your right foot (position C). You should feel a stretch in your left inner thigh (adductors). 7. Hold this position for __________ seconds. Then slowly return to the upright position. Repeat __________ times. Complete this exercise __________ times a day. Lunge This exercise stretches the muscles of the hip (hip flexors). 1. Place your left /  right knee on the floor and bend your other knee so that is directly over your ankle. You should be half-kneeling. 2. Keep good posture with your head over your shoulders. 3. Tighten your buttocks to point your tailbone downward. This will prevent your back from arching too much. 4. You should feel a gentle stretch in the front of your left / right thigh and hip. If you do not feel a stretch, slide your other foot forward slightly and then slowly lunge forward with your  chest up until your knee once again lines up over your ankle. ? Make sure your tailbone continues to point downward. 5. Hold this position for __________ seconds. 6. Slowly return to the starting position. Repeat __________ times. Complete this exercise __________ times a day. Strengthening exercises These exercises build strength and endurance in your hip. Endurance is the ability to use your muscles for a long time, even after they get tired. Bridge This exercise strengthens the muscles of your hip (hip extensors). 1. Lie on your back on a firm surface with your knees bent and your feet flat on the floor. 2. Tighten your buttocks muscles and lift your bottom off the floor until the trunk of your body and your hips are level with your thighs. ? Do not arch your back. ? You should feel the muscles working in your buttocks and the back of your thighs. If you do not feel these muscles, slide your feet 1-2 inches (2.5-5 cm) farther away from your buttocks. 3. Hold this position for __________ seconds. 4. Slowly lower your hips to the starting position. 5. Let your muscles relax completely between repetitions. Repeat __________ times. Complete this exercise __________ times a day. Straight leg raises, side-lying This exercise strengthens the muscles that move the hip joint away from the center of the body (hip abductors). 1. Lie on your side with your left / right leg in the top position. Lie so your head, shoulder, hip, and knee line up. You may bend your bottom knee slightly to help you balance. 2. Roll your hips slightly forward, so your hips are stacked directly over each other and your left / right knee is facing forward. 3. Leading with your heel, lift your top leg 4-6 inches (10-15 cm). You should feel the muscles in your top hip lifting. ? Do not let your foot drift forward. ? Do not let your knee roll toward the ceiling. 4. Hold this position for __________ seconds. 5. Slowly return to  the starting position. 6. Let your muscles relax completely between repetitions. Repeat __________ times. Complete this exercise __________ times a day. Straight leg raises, side-lying This exercise strengthens the muscles that move the hip joint toward the center of the body (hip adductors). 1. Lie on your side with your left / right leg in the bottom position. Lie so your head, shoulder, hip, and knee line up. You may place your upper foot in front to help you balance. 2. Roll your hips slightly forward, so your hips are stacked directly over each other and your left / right knee is facing forward. 3. Tense the muscles in your inner thigh and lift your bottom leg 4-6 inches (10-15 cm). 4. Hold this position for __________ seconds. 5. Slowly return to the starting position. 6. Let your muscles relax completely between repetitions. Repeat __________ times. Complete this exercise __________ times a day. Straight leg raises, supine This exercise strengthens the muscles in the front of your thigh (quadriceps). 1. Lorenz Coaster  on your back (supine position) with your left / right leg extended and your other knee bent. 2. Tense the muscles in the front of your left / right thigh. You should see your kneecap slide up or see increased dimpling just above your knee. 3. Keep these muscles tight as you raise your leg 4-6 inches (10-15 cm) off the floor. Do not let your knee bend. 4. Hold this position for __________ seconds. 5. Keep these muscles tense as you lower your leg. 6. Relax the muscles slowly and completely between repetitions. Repeat __________ times. Complete this exercise __________ times a day. Hip abductors, standing This exercise strengthens the muscles that move the leg and hip joint away from the center of the body (hip abductors). 1. Tie one end of a rubber exercise band or tubing to a secure surface, such as a chair, table, or pole. 2. Loop the other end of the band or tubing around your  left / right ankle. 3. Keeping your ankle with the band or tubing directly opposite the secured end, step away until there is tension in the tubing or band. Hold on to a chair, table, or pole as needed for balance. 4. Lift your left / right leg out to your side. While you do this: ? Keep your back upright. ? Keep your shoulders over your hips. ? Keep your toes pointing forward. ? Make sure to use your hip muscles to slowly lift your leg. Do not tip your body or forcefully lift your leg. 5. Hold this position for __________ seconds. 6. Slowly return to the starting position. Repeat __________ times. Complete this exercise __________ times a day. Squats This exercise strengthens the muscles in the front of your thigh (quadriceps). 1. Stand in a door frame so your feet and knees are in line with the frame. You may place your hands on the frame for balance. 2. Slowly bend your knees and lower your hips like you are going to sit in a chair. ? Keep your lower legs in a straight-up-and-down position. ? Do not let your hips go lower than your knees. ? Do not bend your knees lower than told by your health care provider. ? If your hip pain increases, do not bend as low. 3. Hold this position for ___________ seconds. 4. Slowly push with your legs to return to standing. Do not use your hands to pull yourself to standing. Repeat __________ times. Complete this exercise __________ times a day. This information is not intended to replace advice given to you by your health care provider. Make sure you discuss any questions you have with your health care provider. Document Revised: 01/24/2019 Document Reviewed: 05/01/2018 Elsevier Patient Education  2020 [employer] Back Exercises These exercises help to make your trunk and back strong. They also help to keep the lower back flexible. Doing these exercises can help to prevent back pain or lessen existing pain.  If you have back pain, try to do these  exercises 2-3 times each day or as told by your doctor.  As you get better, do the exercises once each day. Repeat the exercises more often as told by your doctor.  To stop back pain from coming back, do the exercises once each day, or as told by your doctor. Exercises Single knee to chest Do these steps 3-5 times in a row for each leg: 6. Lie on your back on a firm bed or the floor with your legs stretched out. 7. Bring one knee  to your chest. 8. Grab your knee or thigh with both hands and hold them it in place. 9. Pull on your knee until you feel a gentle stretch in your lower back or buttocks. 10. Keep doing the stretch for 10-30 seconds. 11. Slowly let go of your leg and straighten it. Pelvic tilt Do these steps 5-10 times in a row: 8. Lie on your back on a firm bed or the floor with your legs stretched out. 9. Bend your knees so they point up to the ceiling. Your feet should be flat on the floor. 10. Tighten your lower belly (abdomen) muscles to press your lower back against the floor. This will make your tailbone point up to the ceiling instead of pointing down to your feet or the floor. 11. Stay in this position for 5-10 seconds while you gently tighten your muscles and breathe evenly. Cat-cow Do these steps until your lower back bends more easily: 7. Get on your hands and knees on a firm surface. Keep your hands under your shoulders, and keep your knees under your hips. You may put padding under your knees. 8. Let your head hang down toward your chest. Tighten (contract) the muscles in your belly. Point your tailbone toward the floor so your lower back becomes rounded like the back of a cat. 9. Stay in this position for 5 seconds. 10. Slowly lift your head. Let the muscles of your belly relax. Point your tailbone up toward the ceiling so your back forms a sagging arch like the back of a cow. 11. Stay in this position for 5 seconds.  Press-ups Do these steps 5-10 times in a  row: 6. Lie on your belly (face-down) on the floor. 7. Place your hands near your head, about shoulder-width apart. 8. While you keep your back relaxed and keep your hips on the floor, slowly straighten your arms to raise the top half of your body and lift your shoulders. Do not use your back muscles. You may change where you place your hands in order to make yourself more comfortable. 9. Stay in this position for 5 seconds. 10. Slowly return to lying flat on the floor.  Bridges Do these steps 10 times in a row: 7. Lie on your back on a firm surface. 8. Bend your knees so they point up to the ceiling. Your feet should be flat on the floor. Your arms should be flat at your sides, next to your body. 9. Tighten your butt muscles and lift your butt off the floor until your waist is almost as high as your knees. If you do not feel the muscles working in your butt and the back of your thighs, slide your feet 1-2 inches farther away from your butt. 10. Stay in this position for 3-5 seconds. 11. Slowly lower your butt to the floor, and let your butt muscles relax. If this exercise is too easy, try doing it with your arms crossed over your chest. Belly crunches Do these steps 5-10 times in a row: 7. Lie on your back on a firm bed or the floor with your legs stretched out. 8. Bend your knees so they point up to the ceiling. Your feet should be flat on the floor. 9. Cross your arms over your chest. 10. Tip your chin a little bit toward your chest but do not bend your neck. 11. Tighten your belly muscles and slowly raise your chest just enough to lift your shoulder blades a tiny bit off  of the floor. Avoid raising your body higher than that, because it can put too much stress on your low back. 12. Slowly lower your chest and your head to the floor. Back lifts Do these steps 5-10 times in a row: 7. Lie on your belly (face-down) with your arms at your sides, and rest your forehead on the  floor. 8. Tighten the muscles in your legs and your butt. 9. Slowly lift your chest off of the floor while you keep your hips on the floor. Keep the back of your head in line with the curve in your back. Look at the floor while you do this. 10. Stay in this position for 3-5 seconds. 11. Slowly lower your chest and your face to the floor. Contact a doctor if:  Your back pain gets a lot worse when you do an exercise.  Your back pain does not get better 2 hours after you exercise. If you have any of these problems, stop doing the exercises. Do not do them again unless your doctor says it is okay. Get help right away if:  You have sudden, very bad back pain. If this happens, stop doing the exercises. Do not do them again unless your doctor says it is okay. This information is not intended to replace advice given to you by your health care provider. Make sure you discuss any questions you have with your health care provider. Document Revised: 03/15/2018 Document Reviewed: 03/15/2018 Elsevier Patient Education  2020 [employer]

## 2019-10-05 ENCOUNTER — Encounter: Payer: Self-pay | Admitting: Family Medicine

## 2019-10-08 ENCOUNTER — Encounter: Payer: Self-pay | Admitting: Family Medicine

## 2019-10-09 ENCOUNTER — Other Ambulatory Visit: Payer: Self-pay

## 2019-10-09 MED ORDER — CITALOPRAM HYDROBROMIDE 20 MG PO TABS: ORAL_TABLET | ORAL | 3 refills | Status: DC

## 2019-10-09 NOTE — Telephone Encounter (Signed)
Received fax from [facility] requesting refill of citalopram.  Name of Medication: Citalopram Name of Pharmacy: [facility], [facility] Otto Last [place] or Written Date and Quantity: 10/15/18 #90, 3 RF Last Office Visit and Type: 10/02/19 Next Office Visit and Type: 01/01/20 Last Controlled Substance Agreement Date: unknown  Last UDS: unknown

## 2019-10-10 NOTE — Telephone Encounter (Signed)
I spoke with the pharmacy - they did receive the syringes Rx  Pt made aware.

## 2019-10-22 ENCOUNTER — Other Ambulatory Visit: Payer: Self-pay | Admitting: Family Medicine

## 2019-10-23 NOTE — Telephone Encounter (Signed)
Last apt 10/02/19  Next apt 01/01/20 Last fill 09/11/19 #9, 1RF Sent in script

## 2019-10-29 ENCOUNTER — Other Ambulatory Visit: Payer: Self-pay | Admitting: Family Medicine

## 2019-10-29 DIAGNOSIS — E1165 Type 2 diabetes mellitus with hyperglycemia: Secondary | ICD-10-CM

## 2019-10-30 ENCOUNTER — Encounter: Payer: Self-pay | Admitting: Family Medicine

## 2019-10-30 NOTE — Telephone Encounter (Signed)
Rx was last refilled on 10/19/2018 for 53ml with 5 refills. Patient was here for CPE on 10/02/19 - but according to last note, I did not know if you wanted to adjust her dose on this medication? Patient has f/u appt scheduled on 01/01/20.  Please advise on refill. Thank you!

## 2019-11-12 ENCOUNTER — Other Ambulatory Visit: Payer: Self-pay | Admitting: Family Medicine

## 2019-11-12 DIAGNOSIS — R7989 Other specified abnormal findings of blood chemistry: Secondary | ICD-10-CM

## 2019-12-11 ENCOUNTER — Encounter: Payer: Self-pay | Admitting: Family Medicine

## 2020-01-01 ENCOUNTER — Other Ambulatory Visit: Payer: Self-pay

## 2020-01-01 ENCOUNTER — Ambulatory Visit: Payer: PRIVATE HEALTH INSURANCE | Admitting: Family Medicine

## 2020-01-01 ENCOUNTER — Encounter: Payer: Self-pay | Admitting: Family Medicine

## 2020-01-01 VITALS — BP 118/68 | HR 69 | Temp 98.2°F | Ht 63.5 in | Wt 158.0 lb

## 2020-01-01 DIAGNOSIS — R29898 Other symptoms and signs involving the musculoskeletal system: Secondary | ICD-10-CM | POA: Diagnosis not present

## 2020-01-01 DIAGNOSIS — G2581 Restless legs syndrome: Secondary | ICD-10-CM | POA: Diagnosis not present

## 2020-01-01 DIAGNOSIS — E1165 Type 2 diabetes mellitus with hyperglycemia: Secondary | ICD-10-CM | POA: Diagnosis not present

## 2020-01-01 LAB — POCT GLYCOSYLATED HEMOGLOBIN (HGB A1C): Hemoglobin A1C: 7.2 % — AB (ref 4.0–5.6)

## 2020-01-01 NOTE — Patient Instructions (Addendum)
Please take your blood sugar prior to giving insulin a couple of times a week and if it feels low.  Send me the results in 2-3 weeks. I would like to decrease your insulin.   Follow up in 6 months  Please call and schedule an appointment for screening mammogram. A referral is not needed.  [phone]

## 2020-01-01 NOTE — Progress Notes (Signed)
° °  Subjective:    Patient ID: Janice Greene, female    DOB: 05-Feb-1966, 54 y.o.   MRN: [id number]  HPI Chief Complaint  Patient presents with   Follow-up    No new concerns   This is a 54 yo female who presents today for follow up of chronic medical conditions.   DM type 2- currently on insulin levemir 28 units, takes at 10 pm and Glucovance 04-999 bid. Has had some low readings, down to 60, usually in afternoon. Fewer decreases in am. AM blood sugars running 170-180, eats later in summer. Biggest meal is at night. Doesn't check blood sugar prior to giving insulin. Takes insulin around 10 pm.   Leg fatigue- improved since starting B12, vitamin D.   Right hip pain- some pain at night, some stiffness in am, not doing exercises.    Review of Systems Per hpi    Objective:   Physical Exam Physical Exam  Vitals reviewed. Constitutional: Oriented to person, place, and time. Appears well-developed and well-nourished.  HENT:  Head: Normocephalic and atraumatic.  Eyes: Conjunctivae are normal.  Neck: Normal range of motion. Neck supple.  Cardiovascular: Normal rate.   Pulmonary/Chest: Effort normal.  Musculoskeletal: Normal range of motion.  Neurological: Alert and oriented to person, place, and time.  Skin: Skin is warm and dry.  Psychiatric: Normal mood and affect. Behavior is normal. Judgment and thought content normal.    BP 118/68 (BP Location: Left Arm, Patient Position: Sitting, Cuff Size: Normal)    Pulse 69    Temp 98.2 F (36.8 C) (Temporal)    Ht 5' 3.5" (1.613 m)    Wt 158 lb (71.7 kg)    SpO2 98%    BMI 27.55 kg/m  Wt Readings from Last 3 Encounters:  01/01/20 158 lb (71.7 kg)  10/02/19 159 lb 3.2 oz (72.2 kg)  05/06/19 163 lb (73.9 kg)   Results for orders placed or performed in visit on 01/01/20  HgB A1c  Result Value Ref Range   Hemoglobin A1C 7.2 (A) 4.0 - 5.6 %   HbA1c POC (<> result, manual entry)     HbA1c, POC (prediabetic range)     HbA1c, POC  (controlled diabetic range)         Assessment & Plan:  1. Uncontrolled type 2 diabetes mellitus with hyperglycemia (HCC) - significantly improved HgBA1c, has decreased from 8.5 to 7.2.  - she is still having high fasting and I have asked her to check blood sugars in evenings and send me all readings in 2-3 weeks. Would like to be able to decrease her insulin.  - HgB A1c - Microalbumin / creatinine urine ratio - follow up in 6 months  2. RLS (restless legs syndrome) - improved with vitamin B12 injections  3. Leg fatigue - improved with vitamin B12 injections  This visit occurred during the SARS-CoV-2 public health emergency.  Safety protocols were in place, including screening questions prior to the visit, additional usage of staff PPE, and extensive cleaning of exam room while observing appropriate contact time as indicated for disinfecting solutions.    Olean Ree, FNP-BC  [facility], [facility]  01/01/2020 8:34 AM

## 2020-01-03 ENCOUNTER — Other Ambulatory Visit (INDEPENDENT_AMBULATORY_CARE_PROVIDER_SITE_OTHER): Payer: PRIVATE HEALTH INSURANCE

## 2020-01-03 DIAGNOSIS — E1165 Type 2 diabetes mellitus with hyperglycemia: Secondary | ICD-10-CM | POA: Diagnosis not present

## 2020-01-03 LAB — MICROALBUMIN / CREATININE URINE RATIO
Creatinine,U: 130.2 mg/dL
Microalb Creat Ratio: 0.6 mg/g (ref 0.0–30.0)
Microalb, Ur: 0.8 mg/dL (ref 0.0–1.9)

## 2020-01-07 ENCOUNTER — Other Ambulatory Visit: Payer: Self-pay | Admitting: Family Medicine

## 2020-02-21 ENCOUNTER — Other Ambulatory Visit: Payer: Self-pay | Admitting: Family Medicine

## 2020-02-21 DIAGNOSIS — Z1231 Encounter for screening mammogram for malignant neoplasm of breast: Secondary | ICD-10-CM

## 2020-02-23 ENCOUNTER — Encounter: Payer: Self-pay | Admitting: Family Medicine

## 2020-02-25 ENCOUNTER — Other Ambulatory Visit: Payer: Self-pay | Admitting: Family Medicine

## 2020-02-25 ENCOUNTER — Encounter: Payer: Self-pay | Admitting: Oncology

## 2020-02-25 ENCOUNTER — Other Ambulatory Visit (HOSPITAL_COMMUNITY): Payer: Self-pay | Admitting: Oncology

## 2020-02-25 DIAGNOSIS — U071 COVID-19: Secondary | ICD-10-CM

## 2020-02-25 DIAGNOSIS — R7989 Other specified abnormal findings of blood chemistry: Secondary | ICD-10-CM

## 2020-02-25 NOTE — Progress Notes (Signed)
Re: MAB infusion   Called to Discuss with patient about Covid symptoms and the use of regeneron, a monoclonal antibody infusion for those with mild to moderate Covid symptoms and at a high risk of hospitalization.     Pt is qualified for this infusion at the [facility] infusion center due to co-morbid conditions and/or a member of an at-risk group.  DM type II   Unable to reach pt. Left message to return call  Past Medical History:  Diagnosis Date  . Anxiety   . Borderline diabetes   . Diabetes mellitus without complication (HCC)   . Headache(784.0)   . History of blood transfusion    after childbirth  . Hyperlipidemia   . Palpitations     Mignon Pine,  AGNP-C [phone] (Infusion Center Hotline)

## 2020-02-25 NOTE — Progress Notes (Signed)
I connected by phone with  Janice Greene on 02/25/20 at 11:15am to discuss the potential use of an new treatment for mild to moderate COVID-19 viral infection in non-hospitalized patients.   This patient is a age/sex that meets the FDA criteria for Emergency Use Authorization of casirivimab\imdevimab.  Has a (+) direct SARS-CoV-2 viral test result 1. Has mild or moderate COVID-19  2. Is ? 56 years of age and weighs ? 40 kg 3. Is NOT hospitalized due to COVID-19 4. Is NOT requiring oxygen therapy or requiring an increase in baseline oxygen flow rate due to COVID-19 5. Is within 10 days of symptom onset 6. Has at least one of the high risk factor(s) for progression to severe COVID-19 and/or hospitalization as defined in EUA. ? Specific high risk criteria :Obesity, DM    Symptom onset  02/19/20   I have spoken and communicated the following to the patient or parent/caregiver:   1. FDA has authorized the emergency use of casirivimab\imdevimab for the treatment of mild to moderate COVID-19 in adults and pediatric patients with positive results of direct SARS-CoV-2 viral testing who are 51 years of age and older weighing at least 40 kg, and who are at high risk for progressing to severe COVID-19 and/or hospitalization.   2. The significant known and potential risks and benefits of casirivimab\imdevimab, and the extent to which such potential risks and benefits are unknown.   3. Information on available alternative treatments and the risks and benefits of those alternatives, including clinical trials.   4. Patients treated with casirivimab\imdevimab should continue to self-isolate and use infection control measures (e.g., wear mask, isolate, social distance, avoid sharing personal items, clean and disinfect "high touch" surfaces, and frequent handwashing) according to CDC guidelines.    5. The patient or parent/caregiver has the option to accept or refuse casirivimab\imdevimab .   After reviewing this  information with the patient, The patient agreed to proceed with receiving casirivimab\imdevimab infusion and will be provided a copy of the Fact sheet prior to receiving the infusion.Mignon Pine, AGNP-C [phone] (Infusion Center Hotline)

## 2020-02-26 ENCOUNTER — Ambulatory Visit (HOSPITAL_COMMUNITY)
Admission: RE | Admit: 2020-02-26 | Discharge: 2020-02-26 | Disposition: A | Discharge status: Home / Self Care | Payer: PRIVATE HEALTH INSURANCE | Source: Ambulatory Visit | Attending: Pulmonary Disease | Admitting: Pulmonary Disease

## 2020-02-26 DIAGNOSIS — U071 COVID-19: Secondary | ICD-10-CM | POA: Insufficient documentation

## 2020-02-26 MED ORDER — ALBUTEROL SULFATE HFA 108 (90 BASE) MCG/ACT IN AERS: 2.0000 | INHALATION_SPRAY | Freq: Once | RESPIRATORY_TRACT | Status: DC | PRN

## 2020-02-26 MED ORDER — EPINEPHRINE 0.3 MG/0.3ML IJ SOAJ: 0.3000 mg | Freq: Once | INTRAMUSCULAR | Status: DC | PRN

## 2020-02-26 MED ORDER — FAMOTIDINE IN NACL 20-0.9 MG/50ML-% IV SOLN: 20.0000 mg | Freq: Once | INTRAVENOUS | Status: DC | PRN

## 2020-02-26 MED ORDER — SODIUM CHLORIDE 0.9 % IV SOLN: INTRAVENOUS | Status: DC | PRN

## 2020-02-26 MED ORDER — SODIUM CHLORIDE 0.9 % IV SOLN
1200.0000 mg | Freq: Once | INTRAVENOUS | Status: AC
  Administered 2020-02-26: 1200 mg via INTRAVENOUS
  Filled 2020-02-26: qty 10

## 2020-02-26 MED ORDER — METHYLPREDNISOLONE SODIUM SUCC 125 MG IJ SOLR: 125.0000 mg | Freq: Once | INTRAMUSCULAR | Status: DC | PRN

## 2020-02-26 MED ORDER — DIPHENHYDRAMINE HCL 50 MG/ML IJ SOLN: 50.0000 mg | Freq: Once | INTRAMUSCULAR | Status: DC | PRN

## 2020-02-26 NOTE — Progress Notes (Signed)
  Diagnosis: COVID-19  Physician: Dr. Delford Field  Procedure: Covid Infusion Clinic Med: casirivimab\imdevimab infusion - Provided patient with casirivimab\imdevimab fact sheet for patients, parents and caregivers prior to infusion.  Complications: No immediate complications noted.  Discharge: Discharged home   Janice Greene 02/26/2020  ]

## 2020-02-26 NOTE — Discharge Instructions (Signed)

## 2020-02-27 ENCOUNTER — Telehealth: Payer: Self-pay

## 2020-02-27 DIAGNOSIS — Z1231 Encounter for screening mammogram for malignant neoplasm of breast: Secondary | ICD-10-CM

## 2020-02-27 NOTE — Telephone Encounter (Signed)
Called patient's cell phone number. No answer. Per DPR, I left her a voicemail and instructed her to call the office if she is not getting relief from her fever or if she is having difficulty keeping down foods/ liquids.

## 2020-02-27 NOTE — Telephone Encounter (Signed)
[facility] Night - Client TELEPHONE ADVICE RECORD Access[facility]urse Patient Name: Janice Greene Gender: Female DOB: 1966-06-21 Age: 54 Y 1 M 22 D Return Phone Number: [phone] (Primary), [phone] (Secondary) Address[place] [address] Client [facility] Night - Client Client Site  [facility] [place] - Night Physician Deboraha Sprang- NP Contact Type Call Who Is Calling Patient / Member / Family / Caregiver Call Type Triage / Clinical Caller Name Monserrat Vidaurri Relationship To Patient Spouse Return Phone Number [phone] (Primary) Chief Complaint Nausea Reason for Call Symptomatic / Request for Health Information Initial Comment Caller states his wife had a Covid infusion treatment and her temp is 102.5 and nausea Translation No Nurse Assessment Nurse: Mardella Layman, RN, Patsy Lager Date/Time (Eastern Time): 02/26/2020 6:30:54 PM Confirm and document reason for call. If symptomatic, describe symptoms. ---Caller states his wife had a Covid infusion treatment today at 1pm and her temp is 102.5 and she is feeling nauseous. Has the patient had close contact with a person known or suspected to have the novel coronavirus illness OR traveled / lives in area with major community spread (including international travel) in the last 14 days from the onset of symptoms? * If Asymptomatic, screen for exposure and travel within the last 14 days. ---Yes Does the patient have any new or worsening symptoms? ---Yes Will a triage be completed? ---Yes Related visit to physician within the last 2 weeks? ---Yes Does the PT have any chronic conditions? (i.e. diabetes, asthma, this includes High risk factors for pregnancy, etc.) ---Yes List chronic conditions. ---diabetes Is this a behavioral health or substance abuse call? ---No Guidelines Guideline Title Affirmed Question Affirmed Notes Nurse Date/Time  (Eastern Time) COVID-19 - Diagnosed or Suspected [1] COVID-19 infection suspected by caller or triager AND [2] mild symptoms (cough, fever, or others) AND [3] no complications or SOB Mardella Layman, RN, Patsy Lager 02/26/2020 6:32:45 PM PLEASE NOTE: All timestamps contained within this report are represented as [place] Standard Time. CONFIDENTIALTY NOTICE: This fax transmission is intended only for the addressee. It contains information that is legally privileged, confidential or otherwise protected from use or disclosure. If you are not the intended recipient, you are strictly prohibited from reviewing, disclosing, copying using or disseminating any of this information or taking any action in reliance on or regarding this information. If you have received this fax in error, please notify us immediately by telephone so that we can arrange for its return to [place]. Phone: [phone], Toll-Free: [phone], Fax: [phone] Page: 2 of 2 Call Id: [id number] Guidelines Guideline Title Affirmed Question Affirmed Notes Nurse Date/Time Lamount Cohen Time) Poisoning MORE THAN A DOUBLE DOSE of a prescription or over-thecounter (OTC) drug Delsa Grana, Patsy Lager 02/26/2020 6:42:31 PM Disp. Time Lamount Cohen Time) Disposition Final User 02/26/2020 6:42:05 PM Call PCP when Office is Open Mardella Layman, RN, Patsy Lager 02/26/2020 6:44:44 PM Send To RN Personal Mardella Layman, RN, Yolanda 02/26/2020 6:44:12 PM Call Poison Center Now Yes Mardella Layman, RN, Patsy Lager Caller Disagree/Comply Comply Caller Understands Yes PreDisposition Did not know what to do Care Advice Given Per Guideline CALL PCP WHEN OFFICE IS OPEN: CALL BACK IF: * Fever over 103 F (39.4 C) * Fever lasts over 3 days * Fever returns after being gone for 24 hours * Chest pain or difficulty breathing occurs * You become worse. CARE ADVICE given per COVID-19 - DIAGNOSED OR SUSPECTED (Adult) guideline. CALL POISON CENTER NOW: * You need to call the [facility] now. [facility]  advice is a free  service. [facility]: [phone] CARE ADVICE given per Poisoning (Adult) guideline. Referrals REFERRED TO PCP OFFICE

## 2020-02-27 NOTE — Telephone Encounter (Signed)
I spoke with no one and left v/m for pt to cb. FYI to Harlin Heys FNP and [facility] LPN.

## 2020-02-28 ENCOUNTER — Other Ambulatory Visit: Payer: Self-pay | Admitting: Family Medicine

## 2020-02-28 DIAGNOSIS — R110 Nausea: Secondary | ICD-10-CM

## 2020-02-28 DIAGNOSIS — R059 Cough, unspecified: Secondary | ICD-10-CM

## 2020-02-28 MED ORDER — ONDANSETRON 8 MG PO TBDP: 8.0000 mg | ORAL_TABLET | Freq: Three times a day (TID) | ORAL | 0 refills | Status: AC | PRN

## 2020-02-28 MED ORDER — BENZONATATE 100 MG PO CAPS: 100.0000 mg | ORAL_CAPSULE | Freq: Three times a day (TID) | ORAL | 0 refills | Status: DC | PRN

## 2020-02-28 NOTE — Telephone Encounter (Signed)
Still unable to reach pt by phone. 

## 2020-02-28 NOTE — Telephone Encounter (Signed)
Please call patient and let her know that I have sent in something for nausea.

## 2020-02-28 NOTE — Telephone Encounter (Signed)
Patient called in again stating she would like to have something for nausea again. Please advise.

## 2020-02-28 NOTE — Telephone Encounter (Signed)
Please call patient I am glad she is feeling better.  Please tell her to be extra mindful of her blood sugar readings while she is sick.  I have sent in a prescription for Tessalon Perles for her cough.

## 2020-02-28 NOTE — Telephone Encounter (Signed)
Pt is aware as instructed 

## 2020-02-28 NOTE — Telephone Encounter (Signed)
Pt called back and pt is not nauseated at all right now; pt is feeling better in general today; pt said sometimes when she coughs she  Can get SOB that goes away after stops coughing. Pt has not tried OTC cough meds. Pt request D Gessner FNP to call in cough med to [facility] [address]; pt had fever this morning of 100 but took ibuprofen 400 mg and fever is gone now. Pt request cb after Harlin Heys FNP reviews this note.

## 2020-03-02 ENCOUNTER — Telehealth: Payer: Self-pay

## 2020-03-02 NOTE — Telephone Encounter (Signed)
Attempted to call patient to check on her. No answer. Will try again later today.

## 2020-03-02 NOTE — Telephone Encounter (Signed)
I was unable to speak with pts daughter;I spoke with pts husband (DPR signed) pt did not go to ED due to recommendation of EMS; pt is still nauseated but has been trying to drink more, pt has had some abd pain but attributes that to coughing so much; pts husband is not sure if pt is dizzy or not. pts husband thinks pt is urinating but is not sure how much or if urine is dark. Mr Lamere is at work now and will try to ck with pt later this morning. Pt is sleeping now and pts husband said pt rested well last night. UC & E D precautions given and pts husband voiced understanding. FYI to Harlin Heys FNP.

## 2020-03-02 NOTE — Telephone Encounter (Signed)
Patient returned my call. Feeling better, able to keep down liquids and solids. Was evaluated by EMS yesterday, blood sugar 304, had been off metformin. Has restarted. Pulse ox 91-92. No SOB at rest. Diarrhea 5-6x/ day, advised that she can take Immodium. Headache at back of head, some improvement with sumatriptan. No fever. Little cough. She was advised to continue adequate hydration, follow up if any new or worsening symptoms, check blood sugars several times a day.

## 2020-03-02 NOTE — Telephone Encounter (Signed)
[facility] Night - Client TELEPHONE ADVICE RECORD Access[facility]urse Patient Name: Janice Greene Gender: Female DOB: 1965-12-21 Age: 54 Y 1 M 26 D Return Phone Number: [phone] (Primary), [phone] (Secondary) Address: [place] [address] Client  [facility] Night - Client Client Site  [facility] [place] - Night Physician Deboraha Sprang- NP Contact Type Call Who Is Calling Patient / Member / Family / Caregiver Call Type Triage / Clinical Caller Name Katlyn Relationship To Patient Daughter Return Phone Number [phone] (Secondary) Chief Complaint Dehydration (>1 YEAR) Reason for Call Symptomatic / Request for Health Information Initial Comment Caller states her mother is dehydrated and getting over Covid. She has had nausea and vomiting. She has urinated in the last eight hours. She isn't taken her anxiety medication and she is feeling overwhelmed. Translation No Nurse Assessment Nurse: Sande Brothers, RN, Ginny Date/Time (Eastern Time): 03/01/2020 10:00:12 AM Confirm and document reason for call. If symptomatic, describe symptoms. ---Caller states her mother is dehydrated and getting over Covid. She has had nausea and vomiting. She has urinated in the last eight hours. She isn't taken her anxiety medication and she is feeling overwhelmed. Caller concerned patient is dehydrated. Has the patient had close contact with a person known or suspected to have the novel coronavirus illness OR traveled / lives in area with major community spread (including international travel) in the last 14 days from the onset of symptoms? * If Asymptomatic, screen for exposure and travel within the last 14 days. ---Yes Does the patient have any new or worsening symptoms? ---Yes Will a triage be completed? ---Yes Related visit to physician within the last 2 weeks? ---No Does the PT have any chronic conditions? (i.e.  diabetes, asthma, this includes High risk factors for pregnancy, etc.) ---Yes List chronic conditions. ---DM, anxiety Is this a behavioral health or substance abuse call? ---No Guidelines Guideline Title Affirmed Question Affirmed Notes Nurse Date/Time (Eastern Time) COVID-19 - Diagnosed or Suspected MILD difficulty breathing (e.g., minimal/no SOB at [place], [place], Mariane Duval 03/01/2020 10:08:06 AM PLEASE NOTE: All timestamps contained within this report are represented as [place] Standard Time. CONFIDENTIALTY NOTICE: This fax transmission is intended only for the addressee. It contains information that is legally privileged, confidential or otherwise protected from use or disclosure. If you are not the intended recipient, you are strictly prohibited from reviewing, disclosing, copying using or disseminating any of this information or taking any action in reliance on or regarding this information. If you have received this fax in error, please notify us immediately by telephone so that we can arrange for its return to [place]. Phone: [phone], Toll-Free: [phone], Fax: [phone] Page: 2 of 2 Call Id: [id number] Guidelines Guideline Title Affirmed Question Affirmed Notes Nurse Date/Time Lamount Cohen Time) rest, SOB with walking, pulse <100) Disp. Time Lamount Cohen Time) Disposition Final User 03/01/2020 10:15:59 AM Go to ED Now (or PCP triage) Yes Sande Brothers, RN, Ginny Caller Disagree/Comply Comply Caller Understands Yes PreDisposition Did not know what to do Care Advice Given Per Guideline GO TO ED NOW (OR PCP TRIAGE): CARE ADVICE given per COVID-19 - DIAGNOSED OR SUSPECTED (Adult) guideline. Comments User: Sherlynn Carbon, RN Date/Time Lamount Cohen Time): 03/01/2020 10:15:36 AM Patient advising that she feels more comfortable calling an ambulance. Advised she may do that if she feels it is necessary. Referrals GO TO FACILITY UNDECIDED

## 2020-03-04 ENCOUNTER — Encounter: Payer: Self-pay | Admitting: Family Medicine

## 2020-03-04 ENCOUNTER — Other Ambulatory Visit: Payer: Self-pay | Admitting: Family Medicine

## 2020-03-04 NOTE — Telephone Encounter (Signed)
Last refill 01/07/2020 #9 1 ordered. No additional notes.  Please advise.

## 2020-03-10 ENCOUNTER — Ambulatory Visit: Payer: PRIVATE HEALTH INSURANCE

## 2020-03-10 ENCOUNTER — Encounter: Payer: Self-pay | Admitting: Family Medicine

## 2020-03-11 ENCOUNTER — Other Ambulatory Visit: Payer: Self-pay | Admitting: *Deleted

## 2020-03-11 DIAGNOSIS — R059 Cough, unspecified: Secondary | ICD-10-CM

## 2020-03-11 DIAGNOSIS — E1165 Type 2 diabetes mellitus with hyperglycemia: Secondary | ICD-10-CM

## 2020-03-11 MED ORDER — BD PEN NEEDLE MINI U/F 31G X 5 MM MISC: 3 refills | Status: AC

## 2020-03-11 MED ORDER — BENZONATATE 100 MG PO CAPS
100.0000 mg | ORAL_CAPSULE | Freq: Three times a day (TID) | ORAL | 0 refills | Status: DC | PRN
Start: 2020-03-11 — End: 2020-12-28

## 2020-03-11 NOTE — Telephone Encounter (Signed)
Last office visit 01/01/2020 for DM/RLS/Leg fatigue.  Last refilled 02/28/2020 for #40 with no refills.  Next Appt: 07/06/2020 for DM.

## 2020-04-06 ENCOUNTER — Ambulatory Visit
Admission: RE | Admit: 2020-04-06 | Discharge: 2020-04-06 | Disposition: A | Discharge status: Home / Self Care | Payer: PRIVATE HEALTH INSURANCE | Source: Ambulatory Visit | Attending: Family Medicine | Admitting: Family Medicine

## 2020-04-06 DIAGNOSIS — Z1231 Encounter for screening mammogram for malignant neoplasm of breast: Secondary | ICD-10-CM

## 2020-04-24 ENCOUNTER — Encounter: Payer: Self-pay | Admitting: Family Medicine

## 2020-05-04 ENCOUNTER — Other Ambulatory Visit: Payer: Self-pay | Admitting: Family Medicine

## 2020-05-28 ENCOUNTER — Other Ambulatory Visit: Payer: Self-pay | Admitting: Family Medicine

## 2020-05-28 DIAGNOSIS — R7989 Other specified abnormal findings of blood chemistry: Secondary | ICD-10-CM

## 2020-06-01 NOTE — Telephone Encounter (Signed)
Pharmacy requests refill on: Levothyroxine 0.0075 mg   LAST REFILL: 02/26/2020 LAST OV: 01/01/2020 NEXT OV: 07/06/2020 PHARMACY: [id number] [place], [place]  TSH(05/06/2019): 3.4  Pharmacy requests refill on: Glyburide/Metformin 5/500 mg   LAST REFILL: 02/26/2020 LAST OV: 01/01/2020 NEXT OV: 07/06/2020 PHARMACY: [id number] [place], [place]  HgbA1C(01/01/2020):7.2

## 2020-06-02 LAB — HM DIABETES EYE EXAM

## 2020-06-15 ENCOUNTER — Encounter: Payer: Self-pay | Admitting: Family Medicine

## 2020-06-23 ENCOUNTER — Other Ambulatory Visit: Payer: Self-pay | Admitting: Family Medicine

## 2020-06-23 NOTE — Telephone Encounter (Signed)
Pharmacy requests refill on: Sumatriptan 100 mg   LAST REFILL: 05/04/2020 LAST OV: 01/01/2020 NEXT OV: 08/11/2020 PHARMACY: [id number] [place], [place]

## 2020-07-06 ENCOUNTER — Ambulatory Visit: Payer: PRIVATE HEALTH INSURANCE | Admitting: Family Medicine

## 2020-08-11 ENCOUNTER — Encounter: Payer: PRIVATE HEALTH INSURANCE | Admitting: Internal Medicine

## 2020-08-11 ENCOUNTER — Encounter: Payer: Self-pay | Admitting: Internal Medicine

## 2020-08-11 MED ORDER — FENOFIBRATE 54 MG PO TABS: ORAL_TABLET | ORAL | 0 refills | Status: DC

## 2020-08-28 ENCOUNTER — Telehealth: Payer: Self-pay

## 2020-08-28 MED ORDER — SUMATRIPTAN SUCCINATE 100 MG PO TABS: ORAL_TABLET | ORAL | 0 refills | Status: DC

## 2020-08-28 NOTE — Telephone Encounter (Signed)
Pharmacy requests refill on: Sumatriptan 100 mg   LAST REFILL: 06/23/2020 (Q-9, R-1) LAST OV: 01/03/2020 NEXT OV: 09/08/2020 PHARMACY: [facility] [address] [place], [place]

## 2020-09-08 ENCOUNTER — Other Ambulatory Visit: Payer: Self-pay

## 2020-09-08 ENCOUNTER — Encounter: Payer: PRIVATE HEALTH INSURANCE | Admitting: Internal Medicine

## 2020-09-08 MED ORDER — ROSUVASTATIN CALCIUM 10 MG PO TABS: ORAL_TABLET | ORAL | 0 refills | Status: DC

## 2020-09-24 ENCOUNTER — Other Ambulatory Visit: Payer: Self-pay | Admitting: Internal Medicine

## 2020-09-24 ENCOUNTER — Telehealth: Payer: Self-pay

## 2020-09-24 NOTE — Telephone Encounter (Signed)
Pt needs a refill of sumatriptan to last until she sees her new provider at [facility] 12-28-20. [facility]. 

## 2020-09-25 NOTE — Telephone Encounter (Signed)
Janice Greene pt... Pt has TOC with [facility] in June... This was last filled 08/28/2020... please advise

## 2020-10-02 ENCOUNTER — Other Ambulatory Visit: Payer: Self-pay | Admitting: *Deleted

## 2020-10-02 MED ORDER — CITALOPRAM HYDROBROMIDE 20 MG PO TABS: ORAL_TABLET | ORAL | 0 refills | Status: DC

## 2020-10-02 NOTE — Telephone Encounter (Signed)
Received faxed refill request form pharmacy for Citalopram Last refill 10/09/19 #90/3 Last office visit 01/01/20 Upcoming appointment scheduled with Dr. Okey Dupre 12/28/20

## 2020-10-26 ENCOUNTER — Other Ambulatory Visit: Payer: Self-pay | Admitting: Internal Medicine

## 2020-10-28 NOTE — Telephone Encounter (Signed)
Pt needs a refill of sumatriptan to last until she sees her new provider at [facility] 12-28-20. [facility].

## 2020-10-28 NOTE — Addendum Note (Signed)
Addended by: Roena Malady on: 10/28/2020 04:39 PM   Modules accepted: Orders

## 2020-10-29 MED ORDER — SUMATRIPTAN SUCCINATE 100 MG PO TABS: ORAL_TABLET | ORAL | 0 refills | Status: DC

## 2020-11-02 ENCOUNTER — Telehealth: Payer: Self-pay

## 2020-11-02 NOTE — Telephone Encounter (Signed)
Received fax for refill on Vitamin D. Has not been seen in office in almost a year. Will need labs and follow up for more refills. Please call patient to set up appointment with one of our providers

## 2020-11-04 ENCOUNTER — Other Ambulatory Visit: Payer: Self-pay

## 2020-11-04 DIAGNOSIS — R7989 Other specified abnormal findings of blood chemistry: Secondary | ICD-10-CM

## 2020-11-04 MED ORDER — LEVOTHYROXINE SODIUM 75 MCG PO TABS: 75.0000 ug | ORAL_TABLET | Freq: Every day | ORAL | 0 refills | Status: AC

## 2020-11-04 NOTE — Telephone Encounter (Signed)
Pharmacy requests refill on: Levothyroxine 75 mcg   LAST REFILL: 06/01/2020 (Q-90, R-0) LAST OV: 01/01/2020 NEXT OV: 12/28/2020 with Dr. Okey Dupre  PH[facility]ACY: [facility] [phone] [place], [place]  TSH (05/24/2019): 3.40

## 2020-11-05 NOTE — Telephone Encounter (Signed)
PT CALLED IN RETURNING PHONE CALL AND STATED THAT SHE IS GOING TO WAIT TIL HER APT FOR VIT D AND SHE HAS 3-4 WEEKS OF PILLS THAT WILL LAST HER UNTIL HER APT

## 2020-11-05 NOTE — Telephone Encounter (Signed)
Attempted to reach patient to schedule. Lvm asking her to call office 

## 2020-11-27 ENCOUNTER — Other Ambulatory Visit: Payer: Self-pay | Admitting: Family

## 2020-11-27 NOTE — Telephone Encounter (Signed)
  LAST APPOINTMENT DATE: 11/04/2020   NEXT APPOINTMENT DATE:@Visit  date not found  MEDICATION: sumatriptan 100 mg   [facility]ACY[facility] [address] [place], [place]   only has one pill left.   Let patient know to contact pharmacy at the end of the day to make sure medication is ready.  Please notify patient to allow 48-72 hours to process  Encourage patient to contact the pharmacy for refills or they can request refills through [facility]  CLINICAL FILLS OUT ALL BELOW:   LAST REFILL:  QTY:  REFILL DATE:    OTHER COMMENTS:    Okay for refill?  Please advise

## 2020-12-01 NOTE — Telephone Encounter (Signed)
Pt calling to f/u on the last message. Pt is completely out of this medication. Pt would like a call back from nurse as soon as possible

## 2020-12-01 NOTE — Telephone Encounter (Signed)
Name of Medication: sumatriptan Name of Pharmacy: [facility] Last [facility] or Written Date and Quantity: #9,   10/29/20 Last Office Visit and Type: 01/01/20 Next Office Visit and Type: 12/29/20

## 2020-12-02 NOTE — Telephone Encounter (Signed)
Refill request Imitrex Last office visit 01/01/20 Last refill 10/29/20 #9 Upcoming appointment Dr. Okey Dupre 12/28/20

## 2020-12-02 NOTE — Telephone Encounter (Signed)
Pt called to check status on this. She states she is completely out of this medication. She is requesting this to be filled as soon as possible.

## 2020-12-15 ENCOUNTER — Other Ambulatory Visit: Payer: Self-pay | Admitting: Internal Medicine

## 2020-12-28 ENCOUNTER — Ambulatory Visit: Payer: PRIVATE HEALTH INSURANCE | Admitting: Internal Medicine

## 2020-12-28 ENCOUNTER — Other Ambulatory Visit: Payer: Self-pay

## 2020-12-28 ENCOUNTER — Encounter: Payer: Self-pay | Admitting: Internal Medicine

## 2020-12-28 VITALS — BP 120/78 | HR 75 | Temp 98.4°F | Resp 18 | Ht 63.5 in | Wt 152.8 lb

## 2020-12-28 DIAGNOSIS — E039 Hypothyroidism, unspecified: Secondary | ICD-10-CM | POA: Diagnosis not present

## 2020-12-28 DIAGNOSIS — E118 Type 2 diabetes mellitus with unspecified complications: Secondary | ICD-10-CM

## 2020-12-28 DIAGNOSIS — E785 Hyperlipidemia, unspecified: Secondary | ICD-10-CM

## 2020-12-28 DIAGNOSIS — E1169 Type 2 diabetes mellitus with other specified complication: Secondary | ICD-10-CM | POA: Diagnosis not present

## 2020-12-28 DIAGNOSIS — Z800 Family history of malignant neoplasm of digestive organs: Secondary | ICD-10-CM

## 2020-12-28 DIAGNOSIS — G43909 Migraine, unspecified, not intractable, without status migrainosus: Secondary | ICD-10-CM

## 2020-12-28 DIAGNOSIS — F4323 Adjustment disorder with mixed anxiety and depressed mood: Secondary | ICD-10-CM

## 2020-12-28 LAB — MICROALBUMIN / CREATININE URINE RATIO
Creatinine,U: 90.7 mg/dL
Microalb Creat Ratio: 2.2 mg/g (ref 0.0–30.0)
Microalb, Ur: 2 mg/dL — ABNORMAL HIGH (ref 0.0–1.9)

## 2020-12-28 LAB — COMPREHENSIVE METABOLIC PANEL
ALT: 26 U/L (ref 0–35)
AST: 34 U/L (ref 0–37)
Albumin: 4.6 g/dL (ref 3.5–5.2)
Alkaline Phosphatase: 77 U/L (ref 39–117)
BUN: 19 mg/dL (ref 6–23)
CO2: 28 mEq/L (ref 19–32)
Calcium: 9.6 mg/dL (ref 8.4–10.5)
Chloride: 103 mEq/L (ref 96–112)
Creatinine, Ser: 0.89 mg/dL (ref 0.40–1.20)
GFR: 72.19 mL/min (ref >60.00)
Glucose, Bld: 216 mg/dL — ABNORMAL HIGH (ref 70–99)
Potassium: 4.2 mEq/L (ref 3.5–5.1)
Sodium: 140 mEq/L (ref 135–145)
Total Bilirubin: 0.5 mg/dL (ref 0.2–1.2)
Total Protein: 7.5 g/dL (ref 6.0–8.3)

## 2020-12-28 LAB — LIPID PANEL
Cholesterol: 143 mg/dL (ref 0–200)
HDL: 36.1 mg/dL — ABNORMAL LOW (ref >39.00)
NonHDL: 106.69
Total CHOL/HDL Ratio: 4
Triglycerides: 229 mg/dL — ABNORMAL HIGH (ref 0.0–149.0)
VLDL: 45.8 mg/dL — ABNORMAL HIGH (ref 0.0–40.0)

## 2020-12-28 LAB — CBC
HCT: 42.5 % (ref 36.0–46.0)
Hemoglobin: 14.4 g/dL (ref 12.0–15.0)
MCHC: 33.8 g/dL (ref 30.0–36.0)
MCV: 87 fl (ref 78.0–100.0)
Platelets: 333 10*3/uL (ref 150.0–400.0)
RBC: 4.88 Mil/uL (ref 3.87–5.11)
RDW: 13.5 % (ref 11.5–15.5)
WBC: 8 10*3/uL (ref 4.0–10.5)

## 2020-12-28 LAB — TSH: TSH: 2.63 u[IU]/mL (ref 0.35–4.50)

## 2020-12-28 LAB — T4, FREE: Free T4: 0.83 ng/dL (ref 0.60–1.60)

## 2020-12-28 LAB — HEMOGLOBIN A1C: Hgb A1c MFr Bld: 8.9 % — ABNORMAL HIGH (ref 4.6–6.5)

## 2020-12-28 LAB — LDL CHOLESTEROL, DIRECT: Direct LDL: 67 mg/dL

## 2020-12-28 MED ORDER — CITALOPRAM HYDROBROMIDE 10 MG PO TABS: 10.0000 mg | ORAL_TABLET | Freq: Every day | ORAL | 1 refills | Status: DC

## 2020-12-28 NOTE — Patient Instructions (Addendum)
We have sent in the 10 mg ciltalopram to take daily for 2-3 weeks, then take 1/2 pill (5 mg) daily for 1-2 weeks then stop.  We will check the labs today and likely will adjust the diabetes medicine to hopefully stop the insulin.   We will have the GI office contact you for a colonoscopy. They are a little backed up so this may be a few months.

## 2020-12-28 NOTE — Progress Notes (Signed)
   Subjective:   Patient ID: Janice Greene, female    DOB: May 03, 1966, 55 y.o.   MRN: [id number]  HPI The patient is a 55 YO female coming in for transfer of care and would like to discuss coming off citalopram (started when mom ill years ago and feels she does not need it any longer, if missing doses for more than a day or two she notices and would like to wean), and diabetes (she is taking insulin and glyburide/metformin and would like to consider change to regimen, she is working on losing weight and feels that the regimen is holding her back), and cholesterol (taking fenofibrate and crestor and would like to simplify if possible, denies chest pains or stroke symptoms)  PMH, [facility], social history reviewed and updated  Review of Systems  Constitutional: Negative.   HENT: Negative.    Eyes: Negative.   Respiratory:  Negative for cough, chest tightness and shortness of breath.   Cardiovascular:  Negative for chest pain, palpitations and leg swelling.  Gastrointestinal:  Negative for abdominal distention, abdominal pain, constipation, diarrhea, nausea and vomiting.  Musculoskeletal: Negative.   Skin: Negative.   Neurological: Negative.   Psychiatric/Behavioral: Negative.     Objective:  Physical Exam Constitutional:      Appearance: She is well-developed.  HENT:     Head: Normocephalic and atraumatic.  Cardiovascular:     Rate and Rhythm: Normal rate and regular rhythm.  Pulmonary:     Effort: Pulmonary effort is normal. No respiratory distress.     Breath sounds: Normal breath sounds. No wheezing or rales.  Abdominal:     General: Bowel sounds are normal. There is no distension.     Palpations: Abdomen is soft.     Tenderness: There is no abdominal tenderness. There is no rebound.  Musculoskeletal:     Cervical back: Normal range of motion.  Skin:    General: Skin is warm and dry.     Comments: Foot exam done  Neurological:     Mental Status: She is alert and oriented to  person, place, and time.     Coordination: Coordination normal.    Vitals:   12/28/20 0902  BP: 120/78  Pulse: 75  Resp: 18  Temp: 98.4 F (36.9 C)  TempSrc: Oral  SpO2: 97%  Weight: 152 lb 12.8 oz (69.3 kg)  Height: 5' 3.5" (1.613 m)    This visit occurred during the SARS-CoV-2 public health emergency.  Safety protocols were in place, including screening questions prior to the visit, additional usage of staff PPE, and extensive cleaning of exam room while observing appropriate contact time as indicated for disinfecting solutions.   Assessment & Plan:

## 2020-12-29 ENCOUNTER — Encounter: Payer: Self-pay | Admitting: Internal Medicine

## 2020-12-30 NOTE — Assessment & Plan Note (Signed)
Uses sumatriptan and this works well if needed.

## 2020-12-30 NOTE — Assessment & Plan Note (Signed)
Checking HgA1c and will likely adjust regimen to assist with weight loss. She is currently taking glyburide/metformin and levemir 28 units at night time.

## 2020-12-30 NOTE — Assessment & Plan Note (Signed)
Checking lipid panel and adjust fenofibrate and crestor 10 mg daily as needed.

## 2020-12-30 NOTE — Assessment & Plan Note (Signed)
Needs repeat colonoscopy and referral to GI done today.

## 2020-12-30 NOTE — Assessment & Plan Note (Signed)
Taking celexa 20 mg daily and will change to 10 mg daily for 2-3 weeks then 5 mg daily for 2-3 weeks then stop.

## 2020-12-31 ENCOUNTER — Encounter: Payer: Self-pay | Admitting: Internal Medicine

## 2020-12-31 ENCOUNTER — Other Ambulatory Visit: Payer: Self-pay | Admitting: Internal Medicine

## 2020-12-31 ENCOUNTER — Other Ambulatory Visit: Payer: Self-pay | Admitting: Family

## 2020-12-31 MED ORDER — FENOFIBRATE 54 MG PO TABS
ORAL_TABLET | ORAL | 0 refills | Status: DC
Start: 2020-12-31 — End: 2021-04-07

## 2021-01-05 ENCOUNTER — Encounter: Payer: Self-pay | Admitting: Internal Medicine

## 2021-01-05 ENCOUNTER — Other Ambulatory Visit: Payer: Self-pay

## 2021-01-05 MED ORDER — SUMATRIPTAN SUCCINATE 100 MG PO TABS: ORAL_TABLET | ORAL | 6 refills | Status: AC

## 2021-01-10 ENCOUNTER — Other Ambulatory Visit: Payer: Self-pay | Admitting: Internal Medicine

## 2021-01-12 ENCOUNTER — Encounter: Payer: Self-pay | Admitting: Internal Medicine

## 2021-01-12 MED ORDER — RYBELSUS 7 MG PO TABS: 7.0000 mg | ORAL_TABLET | Freq: Every day | ORAL | 1 refills | Status: AC

## 2021-01-12 NOTE — Addendum Note (Signed)
Addended by: Hillard Danker A on: 01/12/2021 10:10 AM   Modules accepted: Orders

## 2021-01-20 ENCOUNTER — Encounter: Payer: Self-pay | Admitting: Internal Medicine

## 2021-02-03 ENCOUNTER — Other Ambulatory Visit: Payer: Self-pay | Admitting: Internal Medicine

## 2021-02-04 ENCOUNTER — Telehealth: Payer: Self-pay | Admitting: Internal Medicine

## 2021-02-05 MED ORDER — GLYBURIDE-METFORMIN 5-500 MG PO TABS: 2.0000 | ORAL_TABLET | Freq: Two times a day (BID) | ORAL | 1 refills | Status: AC

## 2021-02-05 NOTE — Telephone Encounter (Signed)
Medication has been refilled.

## 2021-02-11 ENCOUNTER — Other Ambulatory Visit: Payer: Self-pay | Admitting: Family

## 2021-02-11 DIAGNOSIS — R7989 Other specified abnormal findings of blood chemistry: Secondary | ICD-10-CM

## 2021-03-05 ENCOUNTER — Encounter: Payer: Self-pay | Admitting: Internal Medicine

## 2021-03-05 DIAGNOSIS — E538 Deficiency of other specified B group vitamins: Secondary | ICD-10-CM

## 2021-03-11 MED ORDER — "TUBERCULIN SYRINGE 26G X 3/8"" 1 ML MISC": 1.0000 [IU] | 4 refills | Status: AC | PRN

## 2021-03-18 ENCOUNTER — Other Ambulatory Visit: Payer: Self-pay | Admitting: Internal Medicine

## 2021-04-05 ENCOUNTER — Other Ambulatory Visit: Payer: Self-pay | Admitting: Internal Medicine

## 2021-05-19 ENCOUNTER — Encounter: Payer: Self-pay | Admitting: Internal Medicine

## 2021-06-18 IMAGING — MG DIGITAL SCREENING BREAST BILAT IMPLANT W/ TOMO W/ CAD
8 of 14 series · 8 of 34 positions shown · non-contrast
Comparison: Previous exam(s).

CLINICAL DATA: Screening.

EXAM:
DIGITAL SCREENING BILATERAL MAMMOGRAM WITH IMPLANTS, CAD AND TOMO
The patient has retropectoral implants. Standard and implant
displaced views were performed.

[L MLO]
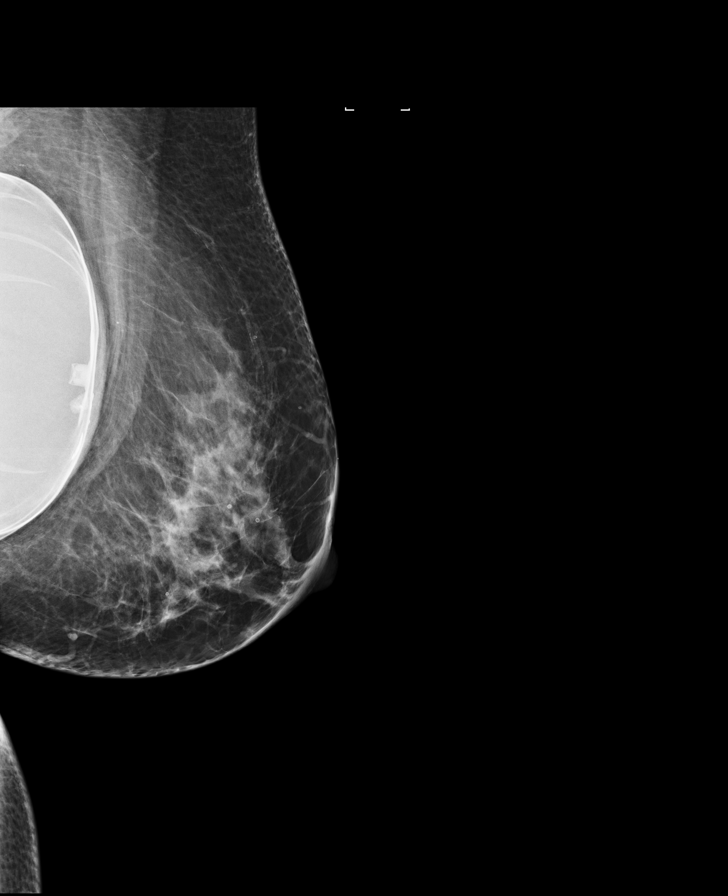

[R CC]
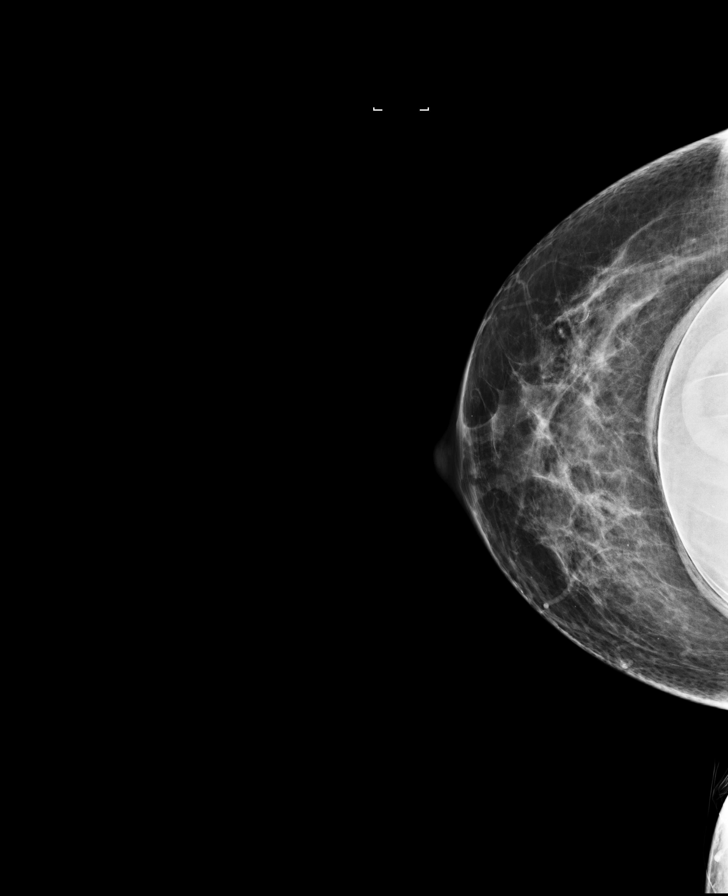

[R MLO]
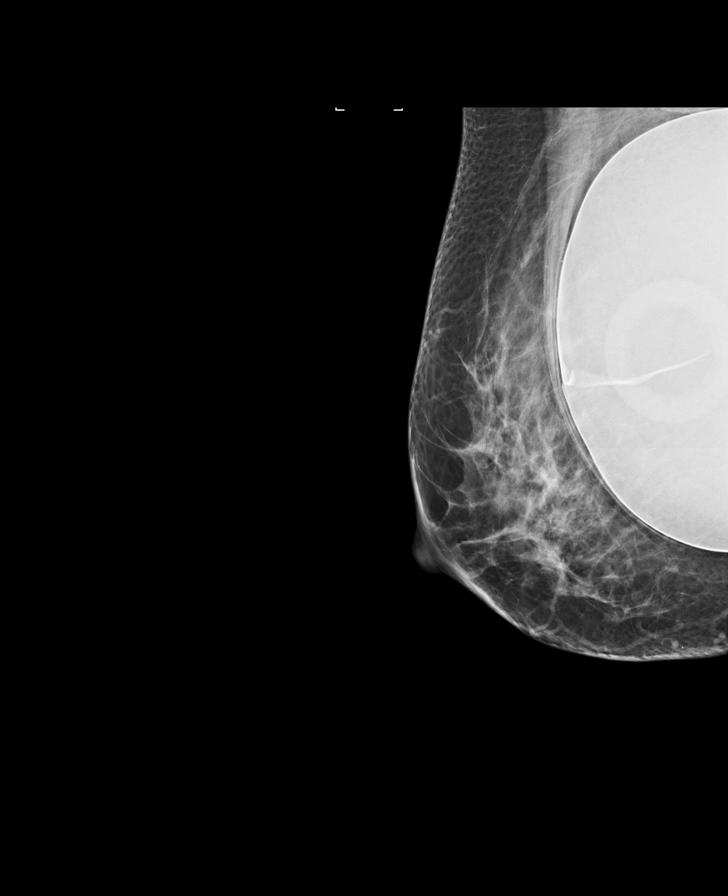

[L CC]
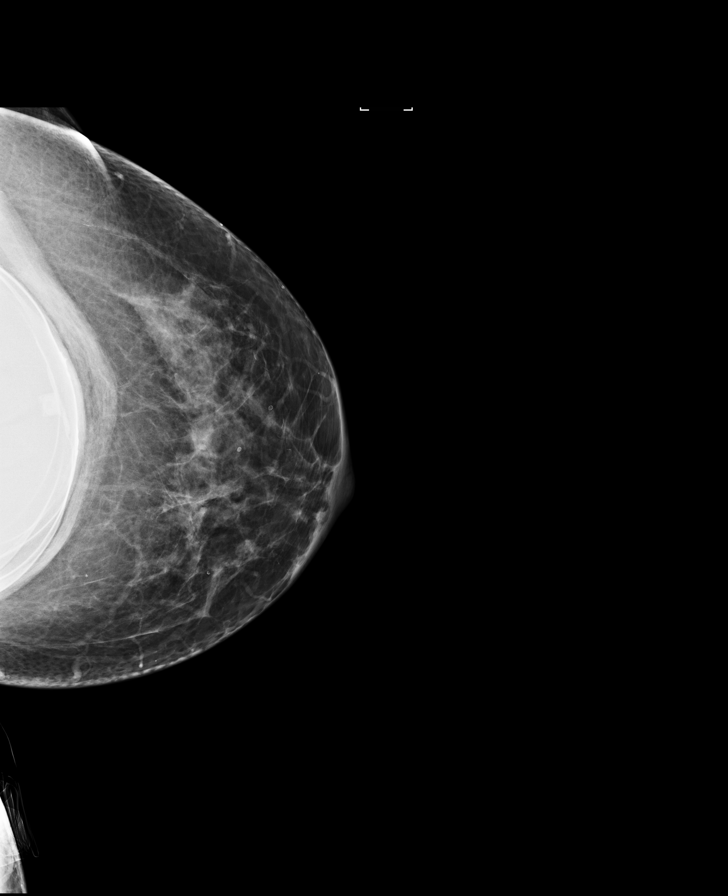

[R MLO synth-2D (1 of 2)]
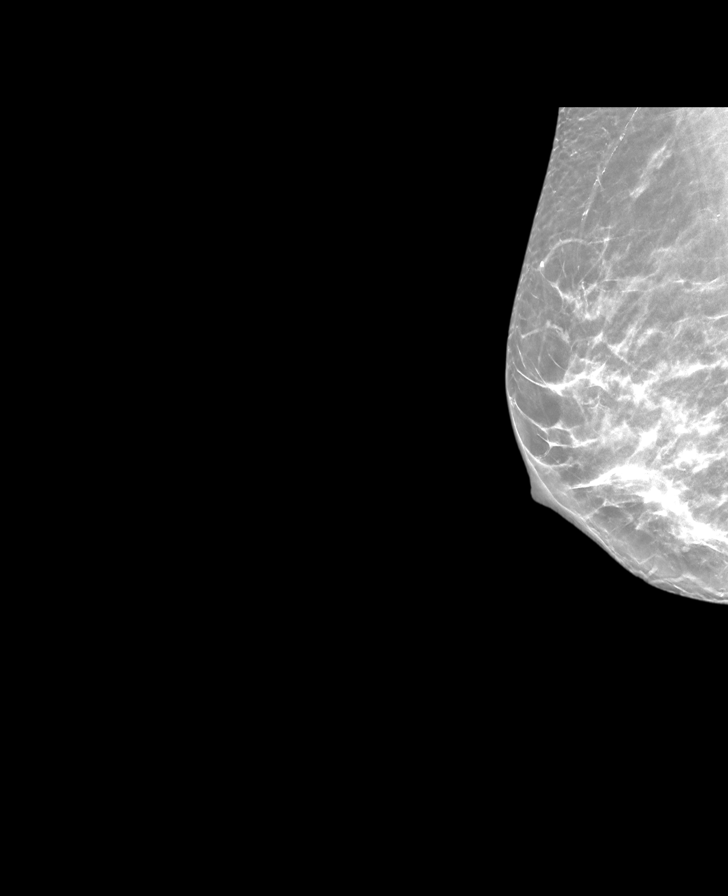

[R MLO synth-2D (2 of 2)]
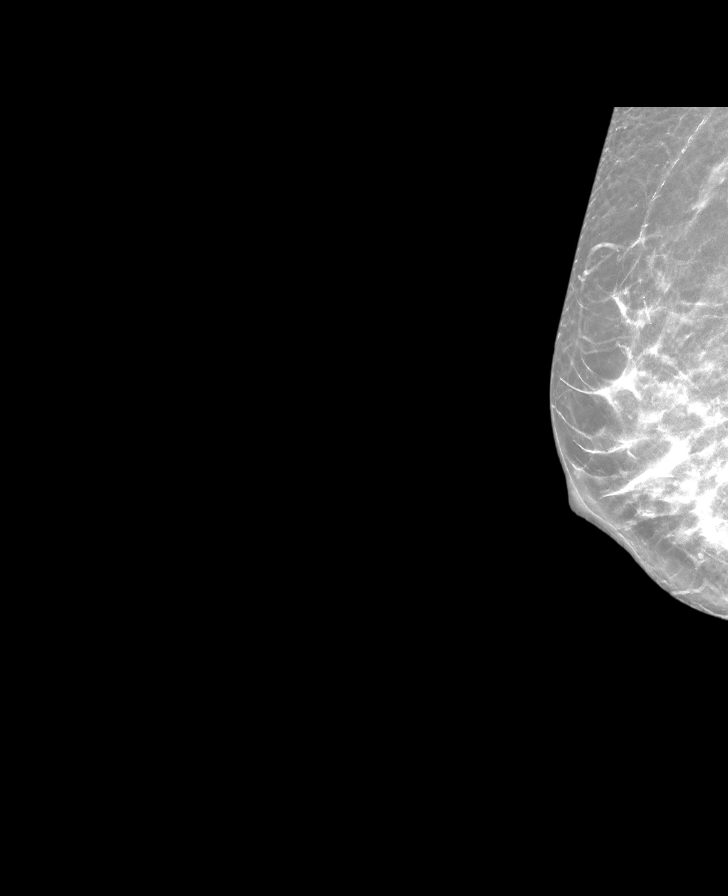

[L CC synth-2D]
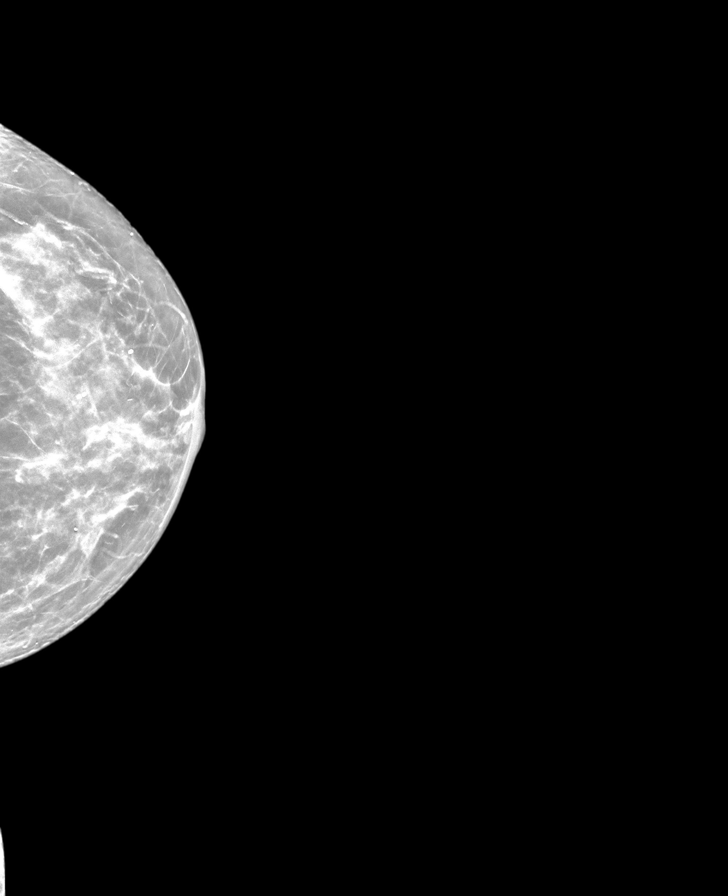

[L MLO synth-2D]
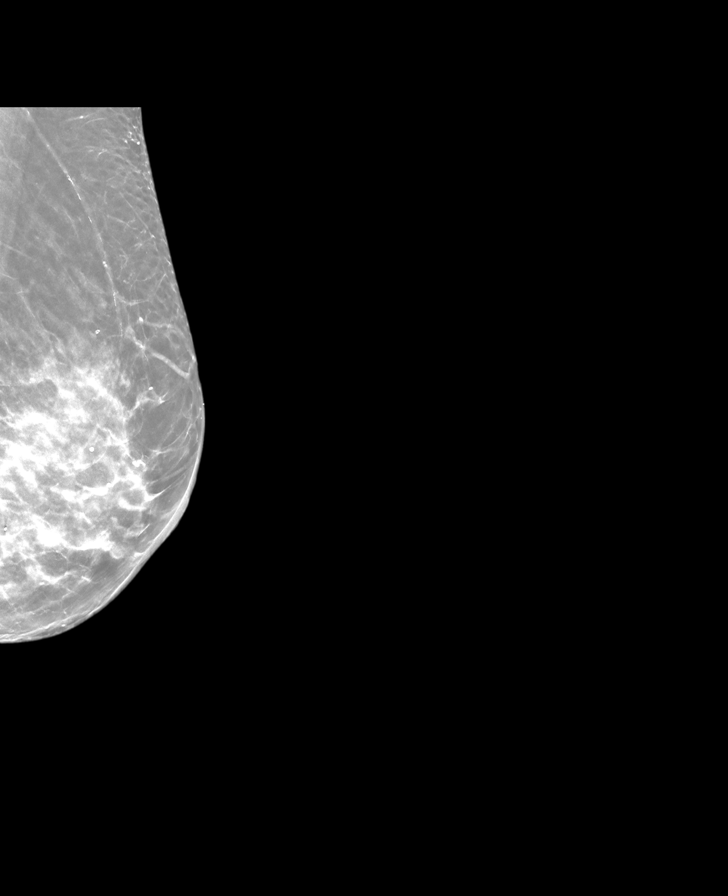

[8 of 34 positions shown; findings below may reference images not displayed]

ACR Breast Density Category c: The breast tissue is heterogeneously
dense, which may obscure small masses.
FINDINGS: There are no findings suspicious for malignancy. Images were
processed with CAD.
IMPRESSION: No mammographic evidence of malignancy. A result letter of this
screening mammogram will be mailed directly to the patient.

RECOMMENDATION:
Screening mammogram in one year. (Code:49-X-OQ9)

BI-RADS CATEGORY  1:  Negative.

## 2021-08-22 ENCOUNTER — Other Ambulatory Visit: Payer: Self-pay | Admitting: Internal Medicine

## 2021-08-24 ENCOUNTER — Other Ambulatory Visit: Payer: Self-pay | Admitting: Internal Medicine

## 2021-12-15 ENCOUNTER — Telehealth: Payer: Self-pay | Admitting: *Deleted

## 2021-12-15 NOTE — Telephone Encounter (Signed)
Rec'd PA on [employer].Jamal Maes to submitted via cover-[employer] w [id number] rec'd msg stating there is an PA opening for [employer] already. Closing encounter.Marland KitchenRaechel Chute

## 2022-01-18 ENCOUNTER — Other Ambulatory Visit: Payer: Self-pay | Admitting: Family Medicine

## 2022-01-18 DIAGNOSIS — Z1231 Encounter for screening mammogram for malignant neoplasm of breast: Secondary | ICD-10-CM

## 2022-01-27 ENCOUNTER — Ambulatory Visit
Admission: RE | Admit: 2022-01-27 | Discharge: 2022-01-27 | Disposition: A | Discharge status: Home / Self Care | Payer: PRIVATE HEALTH INSURANCE | Source: Ambulatory Visit | Attending: Family Medicine | Admitting: Family Medicine

## 2022-01-27 DIAGNOSIS — Z1231 Encounter for screening mammogram for malignant neoplasm of breast: Secondary | ICD-10-CM

## 2023-02-17 LAB — EXTERNAL GENERIC LAB PROCEDURE: COLOGUARD: NEGATIVE

## 2023-02-17 LAB — COLOGUARD: COLOGUARD: NEGATIVE

## 2023-08-17 ENCOUNTER — Ambulatory Visit: Payer: PRIVATE HEALTH INSURANCE | Admitting: Family

## 2023-09-19 ENCOUNTER — Other Ambulatory Visit: Payer: Self-pay | Admitting: Adult Health Nurse Practitioner

## 2023-09-19 DIAGNOSIS — Z1231 Encounter for screening mammogram for malignant neoplasm of breast: Secondary | ICD-10-CM

## 2023-09-28 ENCOUNTER — Ambulatory Visit
Admission: RE | Admit: 2023-09-28 | Discharge: 2023-09-28 | Disposition: A | Discharge status: Home / Self Care | Payer: PRIVATE HEALTH INSURANCE | Source: Ambulatory Visit | Attending: Adult Health Nurse Practitioner | Admitting: Adult Health Nurse Practitioner

## 2023-09-28 DIAGNOSIS — Z1231 Encounter for screening mammogram for malignant neoplasm of breast: Secondary | ICD-10-CM
